# Patient Record
Sex: Male | Born: 1945 | Hispanic: No | Marital: Married | Smoking: Never smoker
Health system: Southern US, Community
[De-identification: ages and names within clinical notes are randomized; demographics above are authoritative.]

## PROBLEM LIST (undated history)

## (undated) DIAGNOSIS — Q394 Esophageal web: Secondary | ICD-10-CM

## (undated) DIAGNOSIS — D649 Anemia, unspecified: Secondary | ICD-10-CM

## (undated) DIAGNOSIS — R55 Syncope and collapse: Secondary | ICD-10-CM

## (undated) DIAGNOSIS — G4730 Sleep apnea, unspecified: Secondary | ICD-10-CM

## (undated) DIAGNOSIS — C8290 Follicular lymphoma, unspecified, unspecified site: Secondary | ICD-10-CM

## (undated) HISTORY — DX: Esophageal web: Q39.4

## (undated) HISTORY — PX: NO PAST SURGERIES: SHX2092

## (undated) HISTORY — DX: Follicular lymphoma, unspecified, unspecified site: C82.90

## (undated) HISTORY — DX: Sleep apnea, unspecified: G47.30

## (undated) HISTORY — DX: Anemia, unspecified: D64.9

## (undated) HISTORY — DX: Syncope and collapse: R55

---

## 2000-06-29 ENCOUNTER — Encounter: Payer: Self-pay | Admitting: Cardiology

## 2000-06-29 ENCOUNTER — Ambulatory Visit (HOSPITAL_COMMUNITY): Admission: RE | Admit: 2000-06-29 | Discharge: 2000-06-29 | Payer: Self-pay | Admitting: Cardiology

## 2007-10-11 ENCOUNTER — Emergency Department (HOSPITAL_COMMUNITY): Admission: EM | Admit: 2007-10-11 | Discharge: 2007-10-11 | Payer: Self-pay | Admitting: Emergency Medicine

## 2010-02-15 ENCOUNTER — Ambulatory Visit (HOSPITAL_COMMUNITY)
Admission: AD | Admit: 2010-02-15 | Discharge: 2010-02-15 | Disposition: A | Discharge status: Home / Self Care | Payer: Medicare Other | Source: Ambulatory Visit | Attending: Pulmonary Disease | Admitting: Pulmonary Disease

## 2010-02-15 DIAGNOSIS — R55 Syncope and collapse: Secondary | ICD-10-CM | POA: Insufficient documentation

## 2010-02-15 LAB — CBC
HCT: 33.5 % — ABNORMAL LOW (ref 39.0–52.0)
Hemoglobin: 11 g/dL — ABNORMAL LOW (ref 13.0–17.0)
MCH: 24.1 pg — ABNORMAL LOW (ref 26.0–34.0)
MCHC: 32.8 g/dL (ref 30.0–36.0)
MCV: 73.5 fL — ABNORMAL LOW (ref 78.0–100.0)
Platelets: 227 10*3/uL (ref 150–400)
RBC: 4.56 MIL/uL (ref 4.22–5.81)
RDW: 19.1 % — ABNORMAL HIGH (ref 11.5–15.5)
WBC: 5.2 10*3/uL (ref 4.0–10.5)

## 2010-02-15 LAB — COMPREHENSIVE METABOLIC PANEL
ALT: 20 U/L (ref 0–53)
AST: 23 U/L (ref 0–37)
Albumin: 3.3 g/dL — ABNORMAL LOW (ref 3.5–5.2)
Alkaline Phosphatase: 64 U/L (ref 39–117)
Calcium: 8.7 mg/dL (ref 8.4–10.5)
GFR calc Af Amer: >60 mL/min (ref >60)
Potassium: 4.2 mEq/L (ref 3.5–5.1)
Sodium: 138 mEq/L (ref 135–145)
Total Protein: 6.3 g/dL (ref 6.0–8.3)

## 2010-02-15 LAB — DIFFERENTIAL
Basophils Absolute: 0 10*3/uL (ref 0.0–0.1)
Basophils Relative: 0 % (ref 0–1)
Eosinophils Absolute: 0 10*3/uL (ref 0.0–0.7)
Eosinophils Relative: 0 % (ref 0–5)
Lymphocytes Relative: 30 % (ref 12–46)
Lymphs Abs: 1.5 10*3/uL (ref 0.7–4.0)
Monocytes Absolute: 0.6 10*3/uL (ref 0.1–1.0)
Monocytes Relative: 12 % (ref 3–12)
Neutro Abs: 3 10*3/uL (ref 1.7–7.7)
Neutrophils Relative %: 58 % (ref 43–77)

## 2010-02-15 LAB — CARDIAC PANEL(CRET KIN+CKTOT+MB+TROPI)
CK, MB: 0.6 ng/mL (ref 0.3–4.0)
Relative Index: 0.3 (ref 0.0–2.5)
Total CK: 231 U/L (ref 7–232)

## 2010-02-15 LAB — MAGNESIUM: Magnesium: 1.9 mg/dL (ref 1.5–2.5)

## 2010-02-15 LAB — D-DIMER, QUANTITATIVE: D-Dimer, Quant: 0.41 ug/mL-FEU (ref 0.00–0.48)

## 2010-02-16 LAB — GLUCOSE, CAPILLARY: Glucose-Capillary: 146 mg/dL — ABNORMAL HIGH (ref 70–99)

## 2010-02-21 ENCOUNTER — Other Ambulatory Visit (HOSPITAL_COMMUNITY): Payer: Self-pay | Admitting: Cardiology

## 2010-03-01 NOTE — H&P (Signed)
  NAMEREXFORD, PREVO             ACCOUNT NO.:  [id number]  MEDICAL RECORD NO.:  [id number]           PATIENT TYPE:  O  LOCATION:  DAY                           FACILITY:  [facility]  PHYSICIAN:  Lanore Renderos L. 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  He was taken to the recovery room and there was found to appear somewhat pale but he had normal blood pressure.  He has some problems with blood sugar but his blood sugar was normal and he had a number of other evaluations including an EKG that did not show any acute changes.  He had extensive lab work that was all within normal limits except he was mildly anemic. His exam showed that he was awake and alert.  He did not appear to be in any acute distress.  His heart was regular.  Abdomen soft and he did not have any neurological abnormalities.  After he received some IV fluids, he had orthostatic vitals checked which were okay.  He desired to go home and so he went home with someone driving him.  He was told to return if he had more trouble.  I offered overnight hospitalization, which he did not want to do and he will follow up with his primary care physician, who is Dr. Virgina Organ in [facility] area.     Quetzaly Ebner L. Juanetta Gosling, M.D.     ELH/MEDQ  D:  02/15/2010  T:  02/15/2010  Job:  [id number]  Electronically Signed by Kari Baars M.D. on 03/01/2010 06:15:01 PM

## 2010-05-12 ENCOUNTER — Other Ambulatory Visit: Payer: Self-pay | Admitting: Cardiology

## 2010-05-12 DIAGNOSIS — R55 Syncope and collapse: Secondary | ICD-10-CM

## 2010-05-19 ENCOUNTER — Encounter (HOSPITAL_COMMUNITY): Admission: RE | Admit: 2010-05-19 | Payer: Medicare Other | Source: Ambulatory Visit

## 2010-05-19 ENCOUNTER — Ambulatory Visit (INDEPENDENT_AMBULATORY_CARE_PROVIDER_SITE_OTHER): Payer: Medicare Other | Admitting: *Deleted

## 2010-05-19 ENCOUNTER — Encounter (HOSPITAL_COMMUNITY): Payer: Self-pay

## 2010-05-19 ENCOUNTER — Ambulatory Visit (HOSPITAL_COMMUNITY)
Admission: RE | Admit: 2010-05-19 | Discharge: 2010-05-19 | Disposition: A | Discharge status: Home / Self Care | Payer: Medicare Other | Source: Ambulatory Visit | Attending: Cardiology | Admitting: Cardiology

## 2010-05-19 ENCOUNTER — Encounter (HOSPITAL_COMMUNITY): Payer: Medicare Other

## 2010-05-19 ENCOUNTER — Encounter (HOSPITAL_COMMUNITY)
Admission: RE | Admit: 2010-05-19 | Discharge: 2010-05-19 | Disposition: A | Discharge status: Home / Self Care | Payer: Medicare Other | Source: Ambulatory Visit | Attending: Cardiology | Admitting: Cardiology

## 2010-05-19 DIAGNOSIS — R55 Syncope and collapse: Secondary | ICD-10-CM | POA: Insufficient documentation

## 2010-05-19 DIAGNOSIS — R0789 Other chest pain: Secondary | ICD-10-CM | POA: Insufficient documentation

## 2010-05-19 DIAGNOSIS — R079 Chest pain, unspecified: Secondary | ICD-10-CM

## 2010-05-19 MED ORDER — TECHNETIUM TC 99M TETROFOSMIN IV KIT
30.0000 | PACK | Freq: Once | INTRAVENOUS | Status: AC | PRN
  Administered 2010-05-19: 29.5 via INTRAVENOUS

## 2010-05-19 MED ORDER — TECHNETIUM TC 99M TETROFOSMIN IV KIT
10.0000 | PACK | Freq: Once | INTRAVENOUS | Status: AC | PRN
  Administered 2010-05-19: 9 via INTRAVENOUS

## 2010-05-19 NOTE — Progress Notes (Deleted)

## 2010-05-22 ENCOUNTER — Encounter: Payer: Self-pay | Admitting: *Deleted

## 2010-05-22 DIAGNOSIS — R55 Syncope and collapse: Secondary | ICD-10-CM | POA: Insufficient documentation

## 2010-05-22 NOTE — Progress Notes (Signed)
Please see dictated report for Stress Nuclear Study.   

## 2010-05-25 ENCOUNTER — Encounter: Payer: Self-pay | Admitting: *Deleted

## 2011-03-19 DIAGNOSIS — B029 Zoster without complications: Secondary | ICD-10-CM | POA: Diagnosis not present

## 2011-03-19 DIAGNOSIS — E119 Type 2 diabetes mellitus without complications: Secondary | ICD-10-CM | POA: Diagnosis not present

## 2011-03-22 DIAGNOSIS — R972 Elevated prostate specific antigen [PSA]: Secondary | ICD-10-CM | POA: Diagnosis not present

## 2011-03-22 DIAGNOSIS — E119 Type 2 diabetes mellitus without complications: Secondary | ICD-10-CM | POA: Diagnosis not present

## 2012-02-20 DIAGNOSIS — N320 Bladder-neck obstruction: Secondary | ICD-10-CM | POA: Diagnosis not present

## 2012-02-20 DIAGNOSIS — N400 Enlarged prostate without lower urinary tract symptoms: Secondary | ICD-10-CM | POA: Diagnosis not present

## 2012-02-20 DIAGNOSIS — E119 Type 2 diabetes mellitus without complications: Secondary | ICD-10-CM | POA: Diagnosis not present

## 2012-06-26 ENCOUNTER — Encounter: Payer: Medicare Other | Admitting: Internal Medicine

## 2012-07-12 DIAGNOSIS — R262 Difficulty in walking, not elsewhere classified: Secondary | ICD-10-CM | POA: Diagnosis not present

## 2012-08-06 DIAGNOSIS — H906 Mixed conductive and sensorineural hearing loss, bilateral: Secondary | ICD-10-CM | POA: Diagnosis not present

## 2012-09-13 DIAGNOSIS — R059 Cough, unspecified: Secondary | ICD-10-CM | POA: Diagnosis not present

## 2012-09-13 DIAGNOSIS — J40 Bronchitis, not specified as acute or chronic: Secondary | ICD-10-CM | POA: Diagnosis not present

## 2012-09-13 DIAGNOSIS — R0602 Shortness of breath: Secondary | ICD-10-CM | POA: Diagnosis not present

## 2012-09-13 DIAGNOSIS — R05 Cough: Secondary | ICD-10-CM | POA: Diagnosis not present

## 2012-09-19 DIAGNOSIS — K219 Gastro-esophageal reflux disease without esophagitis: Secondary | ICD-10-CM | POA: Diagnosis not present

## 2012-09-19 DIAGNOSIS — R1310 Dysphagia, unspecified: Secondary | ICD-10-CM | POA: Diagnosis not present

## 2012-10-17 DIAGNOSIS — R05 Cough: Secondary | ICD-10-CM | POA: Diagnosis not present

## 2012-10-17 DIAGNOSIS — J40 Bronchitis, not specified as acute or chronic: Secondary | ICD-10-CM | POA: Diagnosis not present

## 2012-10-17 DIAGNOSIS — R059 Cough, unspecified: Secondary | ICD-10-CM | POA: Diagnosis not present

## 2012-11-01 DIAGNOSIS — R1310 Dysphagia, unspecified: Secondary | ICD-10-CM | POA: Diagnosis not present

## 2012-11-01 DIAGNOSIS — K449 Diaphragmatic hernia without obstruction or gangrene: Secondary | ICD-10-CM | POA: Diagnosis not present

## 2012-11-01 DIAGNOSIS — K319 Disease of stomach and duodenum, unspecified: Secondary | ICD-10-CM | POA: Diagnosis not present

## 2012-11-01 DIAGNOSIS — K219 Gastro-esophageal reflux disease without esophagitis: Secondary | ICD-10-CM | POA: Diagnosis not present

## 2012-11-01 DIAGNOSIS — K2970 Gastritis, unspecified, without bleeding: Secondary | ICD-10-CM | POA: Diagnosis not present

## 2012-11-01 DIAGNOSIS — D131 Benign neoplasm of stomach: Secondary | ICD-10-CM | POA: Diagnosis not present

## 2012-11-01 DIAGNOSIS — K2289 Other specified disease of esophagus: Secondary | ICD-10-CM | POA: Diagnosis not present

## 2012-11-01 DIAGNOSIS — D371 Neoplasm of uncertain behavior of stomach: Secondary | ICD-10-CM | POA: Diagnosis not present

## 2012-11-01 DIAGNOSIS — K228 Other specified diseases of esophagus: Secondary | ICD-10-CM | POA: Diagnosis not present

## 2012-11-01 DIAGNOSIS — K222 Esophageal obstruction: Secondary | ICD-10-CM | POA: Diagnosis not present

## 2012-11-15 DIAGNOSIS — IMO0001 Reserved for inherently not codable concepts without codable children: Secondary | ICD-10-CM | POA: Diagnosis not present

## 2012-11-15 DIAGNOSIS — Z125 Encounter for screening for malignant neoplasm of prostate: Secondary | ICD-10-CM | POA: Diagnosis not present

## 2012-11-15 DIAGNOSIS — J45909 Unspecified asthma, uncomplicated: Secondary | ICD-10-CM | POA: Diagnosis not present

## 2012-11-15 DIAGNOSIS — Z1329 Encounter for screening for other suspected endocrine disorder: Secondary | ICD-10-CM | POA: Diagnosis not present

## 2012-11-15 DIAGNOSIS — E119 Type 2 diabetes mellitus without complications: Secondary | ICD-10-CM | POA: Diagnosis not present

## 2012-11-15 DIAGNOSIS — R079 Chest pain, unspecified: Secondary | ICD-10-CM | POA: Diagnosis not present

## 2012-12-06 DIAGNOSIS — C8293 Follicular lymphoma, unspecified, intra-abdominal lymph nodes: Secondary | ICD-10-CM | POA: Diagnosis not present

## 2012-12-06 DIAGNOSIS — N200 Calculus of kidney: Secondary | ICD-10-CM | POA: Diagnosis not present

## 2012-12-06 DIAGNOSIS — D500 Iron deficiency anemia secondary to blood loss (chronic): Secondary | ICD-10-CM | POA: Diagnosis not present

## 2012-12-06 DIAGNOSIS — C8589 Other specified types of non-Hodgkin lymphoma, extranodal and solid organ sites: Secondary | ICD-10-CM | POA: Diagnosis not present

## 2012-12-10 ENCOUNTER — Encounter (HOSPITAL_COMMUNITY): Payer: Self-pay

## 2012-12-20 DIAGNOSIS — C8583 Other specified types of non-Hodgkin lymphoma, intra-abdominal lymph nodes: Secondary | ICD-10-CM | POA: Diagnosis not present

## 2012-12-20 DIAGNOSIS — C8299 Follicular lymphoma, unspecified, extranodal and solid organ sites: Secondary | ICD-10-CM | POA: Diagnosis not present

## 2012-12-20 DIAGNOSIS — C8589 Other specified types of non-Hodgkin lymphoma, extranodal and solid organ sites: Secondary | ICD-10-CM | POA: Diagnosis not present

## 2013-01-07 DIAGNOSIS — C8299 Follicular lymphoma, unspecified, extranodal and solid organ sites: Secondary | ICD-10-CM | POA: Diagnosis not present

## 2013-01-09 DIAGNOSIS — H6590 Unspecified nonsuppurative otitis media, unspecified ear: Secondary | ICD-10-CM | POA: Diagnosis not present

## 2013-01-09 DIAGNOSIS — H905 Unspecified sensorineural hearing loss: Secondary | ICD-10-CM | POA: Diagnosis not present

## 2013-01-29 DIAGNOSIS — Z79899 Other long term (current) drug therapy: Secondary | ICD-10-CM | POA: Diagnosis not present

## 2013-05-08 DIAGNOSIS — H538 Other visual disturbances: Secondary | ICD-10-CM | POA: Diagnosis not present

## 2013-05-08 DIAGNOSIS — E119 Type 2 diabetes mellitus without complications: Secondary | ICD-10-CM | POA: Diagnosis not present

## 2013-05-08 DIAGNOSIS — H2510 Age-related nuclear cataract, unspecified eye: Secondary | ICD-10-CM | POA: Diagnosis not present

## 2013-05-09 DIAGNOSIS — IMO0001 Reserved for inherently not codable concepts without codable children: Secondary | ICD-10-CM | POA: Diagnosis not present

## 2013-05-09 DIAGNOSIS — G4730 Sleep apnea, unspecified: Secondary | ICD-10-CM | POA: Diagnosis not present

## 2013-05-13 DIAGNOSIS — E119 Type 2 diabetes mellitus without complications: Secondary | ICD-10-CM | POA: Diagnosis not present

## 2013-05-13 DIAGNOSIS — I10 Essential (primary) hypertension: Secondary | ICD-10-CM | POA: Diagnosis not present

## 2013-05-16 DIAGNOSIS — K449 Diaphragmatic hernia without obstruction or gangrene: Secondary | ICD-10-CM | POA: Diagnosis not present

## 2013-05-16 DIAGNOSIS — C8583 Other specified types of non-Hodgkin lymphoma, intra-abdominal lymph nodes: Secondary | ICD-10-CM | POA: Diagnosis not present

## 2013-05-16 DIAGNOSIS — D509 Iron deficiency anemia, unspecified: Secondary | ICD-10-CM | POA: Diagnosis not present

## 2013-05-16 DIAGNOSIS — K2960 Other gastritis without bleeding: Secondary | ICD-10-CM | POA: Diagnosis not present

## 2013-05-16 DIAGNOSIS — D131 Benign neoplasm of stomach: Secondary | ICD-10-CM | POA: Diagnosis not present

## 2013-05-16 DIAGNOSIS — C8299 Follicular lymphoma, unspecified, extranodal and solid organ sites: Secondary | ICD-10-CM | POA: Diagnosis not present

## 2013-06-26 DIAGNOSIS — IMO0001 Reserved for inherently not codable concepts without codable children: Secondary | ICD-10-CM | POA: Diagnosis not present

## 2013-06-26 DIAGNOSIS — R5381 Other malaise: Secondary | ICD-10-CM | POA: Diagnosis not present

## 2013-06-30 ENCOUNTER — Telehealth: Payer: Self-pay | Admitting: Neurology

## 2013-06-30 ENCOUNTER — Encounter: Payer: Self-pay | Admitting: Neurology

## 2013-06-30 ENCOUNTER — Institutional Professional Consult (permissible substitution): Payer: Self-pay | Admitting: Neurology

## 2013-06-30 ENCOUNTER — Ambulatory Visit (INDEPENDENT_AMBULATORY_CARE_PROVIDER_SITE_OTHER): Payer: Medicare Other | Admitting: Neurology

## 2013-06-30 VITALS — BP 151/91 | HR 104 | Temp 96.9°F | Ht 69.0 in | Wt 169.0 lb

## 2013-06-30 DIAGNOSIS — H9193 Unspecified hearing loss, bilateral: Secondary | ICD-10-CM

## 2013-06-30 DIAGNOSIS — R400 Somnolence: Secondary | ICD-10-CM

## 2013-06-30 DIAGNOSIS — G2581 Restless legs syndrome: Secondary | ICD-10-CM

## 2013-06-30 DIAGNOSIS — G4761 Periodic limb movement disorder: Secondary | ICD-10-CM

## 2013-06-30 DIAGNOSIS — R413 Other amnesia: Secondary | ICD-10-CM

## 2013-06-30 DIAGNOSIS — G4733 Obstructive sleep apnea (adult) (pediatric): Secondary | ICD-10-CM | POA: Diagnosis not present

## 2013-06-30 DIAGNOSIS — G4710 Hypersomnia, unspecified: Secondary | ICD-10-CM | POA: Diagnosis not present

## 2013-06-30 DIAGNOSIS — H9190 Unspecified hearing loss, unspecified ear: Secondary | ICD-10-CM | POA: Diagnosis not present

## 2013-06-30 NOTE — Telephone Encounter (Signed)
Please call Dr. Charlesetta Shanks office for report on cognitive testing to be faxed to me. Her ph: [phone] or [phone], thx Pls give fax number to the area, where Renetta Chalk and Cassandra are.

## 2013-06-30 NOTE — Telephone Encounter (Signed)
Pt should have signed release of info form today at check out. Pls check

## 2013-06-30 NOTE — Telephone Encounter (Signed)
Please check with patient on Dr. Alean Rinne first name and whether pt has signed release of info for Dr. Alean Rinne as well as I want to keep him posted. Please call pt or wife or Daughter, Lutricia Horsfall

## 2013-06-30 NOTE — Telephone Encounter (Signed)
Called Dr Levie Heritage office and they are requesting a release before sending the report, please fax release to [phone].

## 2013-06-30 NOTE — Patient Instructions (Addendum)
I am going to repeat your sleep study and we will get you back on CPAP.  We may need to treat your RLS and your leg kicking.  Your memory loss seems to be very mild and you have some issues with focus and attention, but we have to keep in mind, that you have not been treating your sleep apnea and you've not had your PLMs treated either. You may have problems with your concentration, focus and attention from not sleeping well.  We will do a brain MRI.   Use OTC ear drops to loosen your ear wax on the right.
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
## 2013-07-01 LAB — RPR: RPR: NONREACTIVE

## 2013-07-01 LAB — AMMONIA: Ammonia: 49 ug/dL (ref 27–102)

## 2013-07-01 LAB — TSH: TSH: 1.66 u[IU]/mL (ref 0.450–4.500)

## 2013-07-01 LAB — SEDIMENTATION RATE: Sed Rate: 24 mm/hr (ref 0–30)

## 2013-07-01 LAB — HGB A1C W/O EAG: HEMOGLOBIN A1C: 7.6 % — AB (ref 4.8–5.6)

## 2013-07-01 NOTE — Progress Notes (Signed)
Quick Note:  Please call patient (Dr. Michela Pitcher), and advise him that his TSH, RPR, ESR and ammonia level were normal. His hemoglobin A1c was mildly elevated at 7.6, indicating a mildly suboptimal treatment of his diabetes. He should discuss this with his primary care physician as far as adjustment of his medication for diabetes. Star Age, MD, PhD [facility] ([facility])  ______

## 2013-07-02 ENCOUNTER — Ambulatory Visit (INDEPENDENT_AMBULATORY_CARE_PROVIDER_SITE_OTHER): Payer: Medicare Other | Admitting: Radiology

## 2013-07-02 DIAGNOSIS — R413 Other amnesia: Secondary | ICD-10-CM

## 2013-07-02 DIAGNOSIS — G4733 Obstructive sleep apnea (adult) (pediatric): Secondary | ICD-10-CM

## 2013-07-02 DIAGNOSIS — H9193 Unspecified hearing loss, bilateral: Secondary | ICD-10-CM

## 2013-07-02 DIAGNOSIS — R400 Somnolence: Secondary | ICD-10-CM

## 2013-07-02 DIAGNOSIS — G4761 Periodic limb movement disorder: Secondary | ICD-10-CM

## 2013-07-02 DIAGNOSIS — G2581 Restless legs syndrome: Secondary | ICD-10-CM

## 2013-07-02 NOTE — Progress Notes (Addendum)
Quick Note:  I called and spoke to Dr. Michela Pitcher as well as Helene Kelp, pt cannot hear well on phone) at his office, relating the labs results. Copy placed up front for him when in for EEG today. He verbalized understanding. ______

## 2013-07-02 NOTE — Procedures (Signed)
      Benjamin York is a 68 year old patient with a history of a memory disorder and a history of diabetes. He has had a recent decline in cognitive functioning, and he is being evaluated for this issue.  This is a routine EEG. No skull defects are noted. Medications include aspirin, vitamin D, Glucophage, and Zocor.  EEG classification: Dysrhythmia grade 1 left temporal  Description of the recording: The background rhythms of this recording consists of a fairly well modulated medium amplitude alpha rhythm of 10 Hz that is reactive to eye opening and closure. As the record progresses, the patient appears to remain in the waking state throughout the recording. Photic stimulation is performed and results in a bilateral and symmetric photic driving response. Hyperventilation was not performed. Intermittently during the recording, mild brief 6 Hz theta slowing is seen emanating from the left mid temporal area. This is seen on several occasions during the recording. At no time during the recording does there appear to be evidence of actual spike or spike wave discharges. EKG monitor shows no evidence of cardiac rhythm abnormalities with a heart rate of 84.  Impression: This is a minimally abnormal EEG recording secondary to mild intermittent theta slowing emanating from the left temporal area. This recording suggests a focal abnormality involving the left brain, without definite epileptiform discharges. No electrographic seizures were recorded. Clinical correlation is required.

## 2013-07-03 NOTE — Progress Notes (Signed)
Quick Note:  Please call and advise the patient that the EEG or brain wave test we performed was reported as minimally abnormal showing mild intermittent slowing in the left  temporal area. This recording suggests a focal abnormality involving the left brain, without definite epileptiform discharges. No seizures were recorded. What this finding means in the greater context of his clinical picture is unclear at this time. Let's look at his brain MRI as well which should be scheduled at this time - please inquire. I think we have to await more test results. There is no immediate action required on this EEG result. Please also inquire whether he has been scheduled with Dr. Valentina Shaggy as yet?   Star Age, MD, PhD    ______

## 2013-07-07 ENCOUNTER — Telehealth: Payer: Self-pay | Admitting: Neurology

## 2013-07-07 NOTE — Telephone Encounter (Signed)
I called daughter, she wanted to speak with Dr. Rexene Alberts specifically.  I forwarded message.  She stated this was about CPAP.  [phone].  I relayed that she needs to be on HIPPA.

## 2013-07-07 NOTE — Progress Notes (Signed)
Quick Note:  I called and spoke to pt, who was having trouble hearing. He gave me fax # to fax report, along with Dr. Guadelupe Sabin comments. He is to callback if questions. ______

## 2013-07-07 NOTE — Telephone Encounter (Signed)
Patient's daughter calling to give an update on the patient.

## 2013-07-08 ENCOUNTER — Ambulatory Visit (INDEPENDENT_AMBULATORY_CARE_PROVIDER_SITE_OTHER): Payer: Medicare Other | Admitting: Neurology

## 2013-07-08 DIAGNOSIS — R0609 Other forms of dyspnea: Secondary | ICD-10-CM | POA: Diagnosis not present

## 2013-07-08 DIAGNOSIS — H9193 Unspecified hearing loss, bilateral: Secondary | ICD-10-CM

## 2013-07-08 DIAGNOSIS — G4761 Periodic limb movement disorder: Secondary | ICD-10-CM | POA: Diagnosis not present

## 2013-07-08 DIAGNOSIS — R413 Other amnesia: Secondary | ICD-10-CM

## 2013-07-08 DIAGNOSIS — G2581 Restless legs syndrome: Secondary | ICD-10-CM

## 2013-07-08 DIAGNOSIS — R0989 Other specified symptoms and signs involving the circulatory and respiratory systems: Secondary | ICD-10-CM

## 2013-07-08 DIAGNOSIS — G4733 Obstructive sleep apnea (adult) (pediatric): Secondary | ICD-10-CM

## 2013-07-08 DIAGNOSIS — R400 Somnolence: Secondary | ICD-10-CM

## 2013-07-10 ENCOUNTER — Ambulatory Visit
Admission: RE | Admit: 2013-07-10 | Discharge: 2013-07-10 | Disposition: A | Discharge status: Home / Self Care | Payer: Medicare Other | Source: Ambulatory Visit | Attending: Neurology | Admitting: Neurology

## 2013-07-10 DIAGNOSIS — G2581 Restless legs syndrome: Secondary | ICD-10-CM

## 2013-07-10 DIAGNOSIS — F0390 Unspecified dementia without behavioral disturbance: Secondary | ICD-10-CM | POA: Diagnosis not present

## 2013-07-10 DIAGNOSIS — H9190 Unspecified hearing loss, unspecified ear: Secondary | ICD-10-CM | POA: Diagnosis not present

## 2013-07-10 DIAGNOSIS — G4733 Obstructive sleep apnea (adult) (pediatric): Secondary | ICD-10-CM

## 2013-07-10 DIAGNOSIS — H9193 Unspecified hearing loss, bilateral: Secondary | ICD-10-CM

## 2013-07-10 DIAGNOSIS — R413 Other amnesia: Secondary | ICD-10-CM | POA: Diagnosis not present

## 2013-07-10 DIAGNOSIS — G4761 Periodic limb movement disorder: Secondary | ICD-10-CM

## 2013-07-10 DIAGNOSIS — R400 Somnolence: Secondary | ICD-10-CM

## 2013-07-10 MED ORDER — GADOBENATE DIMEGLUMINE 529 MG/ML IV SOLN
15.0000 mL | Freq: Once | INTRAVENOUS | Status: AC | PRN
  Administered 2013-07-10: 15 mL via INTRAVENOUS

## 2013-07-10 NOTE — Telephone Encounter (Signed)
I called and talked to his daughter. I explained EEG findings. I also  told her that I would be reading his sleep study expeditiously tomorrow. We will also try to expedite the reading on his MRI by tomorrow so I can call her back with both test results. She was in agreement and appreciative.

## 2013-07-11 ENCOUNTER — Telehealth: Payer: Self-pay | Admitting: Neurology

## 2013-07-11 NOTE — Telephone Encounter (Signed)
Please call daughter, Lutricia Horsfall, to make FU appt. Her cell # [phone]. thx

## 2013-07-14 NOTE — Telephone Encounter (Signed)
I called and spoke with the patient's daughter about scheduling follow up appt. with Dr. Rexene Alberts. Patient is scheduled for August 21,2015 at 10:30 am with arrival time of 10:15 am. Patient's daughter has asked that sleep study and MRI reports be sent to her office. ( Fax# [phone].

## 2013-07-15 ENCOUNTER — Telehealth: Payer: Self-pay | Admitting: Neurology

## 2013-07-15 DIAGNOSIS — G2581 Restless legs syndrome: Secondary | ICD-10-CM

## 2013-07-15 DIAGNOSIS — G479 Sleep disorder, unspecified: Secondary | ICD-10-CM

## 2013-07-15 DIAGNOSIS — G4761 Periodic limb movement disorder: Secondary | ICD-10-CM

## 2013-07-15 MED ORDER — ROPINIROLE HCL 0.25 MG PO TABS: ORAL_TABLET | ORAL | Status: AC

## 2013-07-15 NOTE — Telephone Encounter (Addendum)
I called daughter, and relayed Dr. Guadelupe Sabin note.   Pt has been placed on requip since Monday.  She stated that she had spoken to DrMarland Kitchen Rexene Alberts and knew the plan.  She knew pt was iron deficit, and will f/u with pcp about this.  She called back and relayed that pt has appt with [facility] on 07-29-13.

## 2013-07-15 NOTE — Telephone Encounter (Signed)
Dr Oneida Alar left pager number to contact concerning patient,  [phone]

## 2013-07-15 NOTE — Telephone Encounter (Signed)
Tried a couple of times. It sounds like a pager number. Pls try again and maybe get direct phone # or cell #, thx

## 2013-07-15 NOTE — Telephone Encounter (Signed)
Please call patient stopped her and advised her that I have sent a prescription for Requip generic to the pharmacy on file to start treatment for his restless leg syndrome and periodic leg movements. I was trying to order lab work for iron deficiency and anemia but the insurance does not seem to cover this for this indication. He may be able to get workup for iron deficiency to his primary care physician. Please advise daughter in that regard

## 2013-07-15 NOTE — Telephone Encounter (Signed)
I just finished a 15 minute conversation with Dr. Oneida Alar regarding updates on Dr. Michela Pitcher: I explained to her my findings on his sleep study and explained to her that while he does have a sleep disorder, significant sleep apnea was not detected. He did have poor sleep consolidation and poor sleep efficiency and significant PLMS with arousals. 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 I also told her that I had a long phone conversation with the patient's daughter on Friday, 07/11/13, during which I explained his MRI findings to the daughter as well as the sleep study findings. She was agreeable to pursuing workup and treatment for RLS and PLMS. She reported that he has been sleeping with his CPAP machine and slept better. I explained to the daughter that based on his sleep study report there was no indication to bring him back for a CPAP titration.  Furthermore, I explained to Dr. Oneida Alar the findings on his brain MRI and that the daughter called back yesterday to request a second read by a radiologist at [facility].  Dr. Oneida Alar shared with me a office note from his primary care physician which quoted me and saying that I do not see a reason why the patient should be restricted in his practice. When I initially talked to the patient and his family I did not have any additional information as to what his issues had been at work. I also did not have his neuropsych eval report from Dr. Birdie Sons for review at the time. Based on his initial office visit and the bedside memory score of 30 out of 30 on the MMSE I explained to the family that he did not have any obvious signs or symptoms of dementia. Frontal lobe dysfunction can be subtle, frontotemporal dementia is a complex disorder which requires diagnostic help in the form of a neuropsychological evaluation, imaging testing, and ruling out of treatable causes. I also explained to the family that we needed to do more  workup and I personally did not suggest workup at [facility] at the time but did talk to the daughter on 07/11/2013 about potentially pursuing a referral to either [facility] or [facility] to behavioral neurology. I also mentioned to the daughter that we can potentially try to get a PET scan for further diagnostic help.  As it stands, I offered the following to the daughter, lab work and treatment form PLMD, referral to behavioral neurology at either [facility] or [place]. The patient has already been scheduled for a neuropsychological evaluation at [facility] and she wanted to wait until after that appointment.

## 2013-07-16 ENCOUNTER — Encounter: Payer: Self-pay | Admitting: *Deleted

## 2013-07-19 DIAGNOSIS — G3184 Mild cognitive impairment, so stated: Secondary | ICD-10-CM | POA: Diagnosis not present

## 2013-07-21 ENCOUNTER — Encounter: Payer: Self-pay | Admitting: Neurology

## 2013-07-21 ENCOUNTER — Other Ambulatory Visit: Payer: Self-pay | Admitting: Neurology

## 2013-07-21 DIAGNOSIS — R0609 Other forms of dyspnea: Secondary | ICD-10-CM | POA: Diagnosis not present

## 2013-07-21 DIAGNOSIS — R413 Other amnesia: Secondary | ICD-10-CM

## 2013-07-21 DIAGNOSIS — R0989 Other specified symptoms and signs involving the circulatory and respiratory systems: Secondary | ICD-10-CM | POA: Diagnosis not present

## 2013-07-21 NOTE — Progress Notes (Signed)
Faxing letter to Dr. Oneida Alar today.  Fax [phone]

## 2013-07-21 NOTE — Progress Notes (Signed)
I talked to patient's daughter on Friday afternoon, 07/18/2013. She is requesting that her father's brain MRI PRE read by Dr. Bernita Raisin at [facility]. I have placed an order for this.

## 2013-07-29 DIAGNOSIS — R000 Tachycardia, unspecified: Secondary | ICD-10-CM | POA: Diagnosis not present

## 2013-07-29 DIAGNOSIS — G3184 Mild cognitive impairment, so stated: Secondary | ICD-10-CM | POA: Diagnosis not present

## 2013-07-29 DIAGNOSIS — L299 Pruritus, unspecified: Secondary | ICD-10-CM | POA: Diagnosis not present

## 2013-07-29 DIAGNOSIS — G4733 Obstructive sleep apnea (adult) (pediatric): Secondary | ICD-10-CM | POA: Diagnosis not present

## 2013-07-29 DIAGNOSIS — Z79899 Other long term (current) drug therapy: Secondary | ICD-10-CM | POA: Diagnosis not present

## 2013-07-29 DIAGNOSIS — E119 Type 2 diabetes mellitus without complications: Secondary | ICD-10-CM | POA: Diagnosis not present

## 2013-07-29 DIAGNOSIS — G2581 Restless legs syndrome: Secondary | ICD-10-CM | POA: Diagnosis not present

## 2013-07-30 DIAGNOSIS — H906 Mixed conductive and sensorineural hearing loss, bilateral: Secondary | ICD-10-CM | POA: Diagnosis not present

## 2013-07-30 DIAGNOSIS — H6690 Otitis media, unspecified, unspecified ear: Secondary | ICD-10-CM | POA: Diagnosis not present

## 2013-07-30 DIAGNOSIS — Z462 Encounter for fitting and adjustment of other devices related to nervous system and special senses: Secondary | ICD-10-CM | POA: Diagnosis not present

## 2013-08-04 DIAGNOSIS — R5381 Other malaise: Secondary | ICD-10-CM | POA: Diagnosis not present

## 2013-08-04 DIAGNOSIS — K922 Gastrointestinal hemorrhage, unspecified: Secondary | ICD-10-CM | POA: Diagnosis not present

## 2013-08-04 DIAGNOSIS — G2581 Restless legs syndrome: Secondary | ICD-10-CM | POA: Diagnosis not present

## 2013-08-04 DIAGNOSIS — R5383 Other fatigue: Secondary | ICD-10-CM | POA: Diagnosis not present

## 2013-08-04 DIAGNOSIS — D649 Anemia, unspecified: Secondary | ICD-10-CM | POA: Diagnosis not present

## 2013-08-08 DIAGNOSIS — D508 Other iron deficiency anemias: Secondary | ICD-10-CM | POA: Diagnosis not present

## 2013-08-08 DIAGNOSIS — D509 Iron deficiency anemia, unspecified: Secondary | ICD-10-CM | POA: Diagnosis not present

## 2013-08-08 DIAGNOSIS — C8299 Follicular lymphoma, unspecified, extranodal and solid organ sites: Secondary | ICD-10-CM | POA: Diagnosis not present

## 2013-08-08 DIAGNOSIS — D500 Iron deficiency anemia secondary to blood loss (chronic): Secondary | ICD-10-CM | POA: Diagnosis not present

## 2013-08-11 DIAGNOSIS — C8299 Follicular lymphoma, unspecified, extranodal and solid organ sites: Secondary | ICD-10-CM | POA: Diagnosis not present

## 2013-08-11 DIAGNOSIS — D500 Iron deficiency anemia secondary to blood loss (chronic): Secondary | ICD-10-CM | POA: Diagnosis not present

## 2013-08-13 ENCOUNTER — Ambulatory Visit: Payer: Medicare Other | Admitting: Gastroenterology

## 2013-08-13 ENCOUNTER — Telehealth: Payer: Self-pay | Admitting: Gastroenterology

## 2013-08-13 NOTE — Telephone Encounter (Signed)
Someone from patient's office called to let us know that Dr Michela Pitcher already has a GI doctor and doesn't need this OV that was scheduled for today to see SF.

## 2013-08-13 NOTE — Telephone Encounter (Signed)
REVIEWED.  

## 2013-08-18 DIAGNOSIS — D508 Other iron deficiency anemias: Secondary | ICD-10-CM | POA: Diagnosis not present

## 2013-08-18 DIAGNOSIS — D500 Iron deficiency anemia secondary to blood loss (chronic): Secondary | ICD-10-CM | POA: Diagnosis not present

## 2013-08-22 ENCOUNTER — Institutional Professional Consult (permissible substitution): Payer: Medicare Other | Admitting: Neurology

## 2013-08-22 DIAGNOSIS — R599 Enlarged lymph nodes, unspecified: Secondary | ICD-10-CM | POA: Diagnosis not present

## 2013-08-22 DIAGNOSIS — N400 Enlarged prostate without lower urinary tract symptoms: Secondary | ICD-10-CM | POA: Diagnosis not present

## 2013-08-22 DIAGNOSIS — R911 Solitary pulmonary nodule: Secondary | ICD-10-CM | POA: Diagnosis not present

## 2013-08-22 DIAGNOSIS — C8589 Other specified types of non-Hodgkin lymphoma, extranodal and solid organ sites: Secondary | ICD-10-CM | POA: Diagnosis not present

## 2013-08-22 DIAGNOSIS — N289 Disorder of kidney and ureter, unspecified: Secondary | ICD-10-CM | POA: Diagnosis not present

## 2013-08-22 DIAGNOSIS — D509 Iron deficiency anemia, unspecified: Secondary | ICD-10-CM | POA: Diagnosis not present

## 2013-08-22 DIAGNOSIS — G3184 Mild cognitive impairment, so stated: Secondary | ICD-10-CM | POA: Diagnosis not present

## 2013-08-22 DIAGNOSIS — C8299 Follicular lymphoma, unspecified, extranodal and solid organ sites: Secondary | ICD-10-CM | POA: Diagnosis not present

## 2013-09-02 DIAGNOSIS — G3184 Mild cognitive impairment, so stated: Secondary | ICD-10-CM | POA: Diagnosis not present

## 2013-09-02 DIAGNOSIS — G259 Extrapyramidal and movement disorder, unspecified: Secondary | ICD-10-CM | POA: Diagnosis not present

## 2013-09-02 DIAGNOSIS — G2581 Restless legs syndrome: Secondary | ICD-10-CM | POA: Diagnosis not present

## 2013-09-02 DIAGNOSIS — G20A1 Parkinson's disease without dyskinesia, without mention of fluctuations: Secondary | ICD-10-CM | POA: Diagnosis not present

## 2013-09-02 DIAGNOSIS — G4733 Obstructive sleep apnea (adult) (pediatric): Secondary | ICD-10-CM | POA: Diagnosis not present

## 2013-09-02 DIAGNOSIS — G4710 Hypersomnia, unspecified: Secondary | ICD-10-CM | POA: Diagnosis not present

## 2013-09-02 DIAGNOSIS — G20 Parkinson's disease: Secondary | ICD-10-CM | POA: Diagnosis not present

## 2013-09-05 DIAGNOSIS — C8299 Follicular lymphoma, unspecified, extranodal and solid organ sites: Secondary | ICD-10-CM | POA: Diagnosis not present

## 2013-09-05 DIAGNOSIS — D508 Other iron deficiency anemias: Secondary | ICD-10-CM | POA: Diagnosis not present

## 2013-09-05 DIAGNOSIS — D500 Iron deficiency anemia secondary to blood loss (chronic): Secondary | ICD-10-CM | POA: Diagnosis not present

## 2013-09-19 DIAGNOSIS — Z862 Personal history of diseases of the blood and blood-forming organs and certain disorders involving the immune mechanism: Secondary | ICD-10-CM | POA: Diagnosis not present

## 2013-09-19 DIAGNOSIS — D509 Iron deficiency anemia, unspecified: Secondary | ICD-10-CM | POA: Diagnosis not present

## 2013-09-24 DIAGNOSIS — G2589 Other specified extrapyramidal and movement disorders: Secondary | ICD-10-CM | POA: Diagnosis not present

## 2013-09-24 DIAGNOSIS — G3184 Mild cognitive impairment, so stated: Secondary | ICD-10-CM | POA: Diagnosis not present

## 2013-09-24 DIAGNOSIS — G4733 Obstructive sleep apnea (adult) (pediatric): Secondary | ICD-10-CM | POA: Diagnosis not present

## 2013-10-02 DIAGNOSIS — G20 Parkinson's disease: Secondary | ICD-10-CM | POA: Diagnosis not present

## 2013-10-02 DIAGNOSIS — G259 Extrapyramidal and movement disorder, unspecified: Secondary | ICD-10-CM | POA: Diagnosis not present

## 2013-10-30 DIAGNOSIS — H7192 Unspecified cholesteatoma, left ear: Secondary | ICD-10-CM | POA: Diagnosis not present

## 2013-10-30 DIAGNOSIS — G2581 Restless legs syndrome: Secondary | ICD-10-CM | POA: Diagnosis not present

## 2013-10-30 DIAGNOSIS — Z8572 Personal history of non-Hodgkin lymphomas: Secondary | ICD-10-CM | POA: Diagnosis not present

## 2013-10-30 DIAGNOSIS — G7289 Other specified myopathies: Secondary | ICD-10-CM | POA: Diagnosis not present

## 2013-10-30 DIAGNOSIS — E119 Type 2 diabetes mellitus without complications: Secondary | ICD-10-CM | POA: Diagnosis not present

## 2013-10-30 DIAGNOSIS — H903 Sensorineural hearing loss, bilateral: Secondary | ICD-10-CM | POA: Diagnosis not present

## 2013-10-30 DIAGNOSIS — H905 Unspecified sensorineural hearing loss: Secondary | ICD-10-CM | POA: Diagnosis not present

## 2013-10-30 DIAGNOSIS — H908 Mixed conductive and sensorineural hearing loss, unspecified: Secondary | ICD-10-CM | POA: Diagnosis not present

## 2013-10-30 DIAGNOSIS — G4733 Obstructive sleep apnea (adult) (pediatric): Secondary | ICD-10-CM | POA: Diagnosis not present

## 2013-10-31 DIAGNOSIS — Z23 Encounter for immunization: Secondary | ICD-10-CM | POA: Diagnosis not present

## 2013-10-31 DIAGNOSIS — E1149 Type 2 diabetes mellitus with other diabetic neurological complication: Secondary | ICD-10-CM | POA: Diagnosis not present

## 2013-11-05 DIAGNOSIS — E1149 Type 2 diabetes mellitus with other diabetic neurological complication: Secondary | ICD-10-CM | POA: Diagnosis not present

## 2013-11-05 DIAGNOSIS — Z125 Encounter for screening for malignant neoplasm of prostate: Secondary | ICD-10-CM | POA: Diagnosis not present

## 2013-11-05 DIAGNOSIS — I10 Essential (primary) hypertension: Secondary | ICD-10-CM | POA: Diagnosis not present

## 2013-11-07 DIAGNOSIS — D509 Iron deficiency anemia, unspecified: Secondary | ICD-10-CM | POA: Diagnosis not present

## 2013-11-07 DIAGNOSIS — E611 Iron deficiency: Secondary | ICD-10-CM | POA: Diagnosis not present

## 2013-11-14 DIAGNOSIS — G20 Parkinson's disease: Secondary | ICD-10-CM | POA: Diagnosis not present

## 2013-11-18 DIAGNOSIS — C8290 Follicular lymphoma, unspecified, unspecified site: Secondary | ICD-10-CM | POA: Diagnosis not present

## 2013-11-18 DIAGNOSIS — Z23 Encounter for immunization: Secondary | ICD-10-CM | POA: Diagnosis not present

## 2013-11-18 DIAGNOSIS — D509 Iron deficiency anemia, unspecified: Secondary | ICD-10-CM | POA: Diagnosis not present

## 2013-11-18 DIAGNOSIS — G3184 Mild cognitive impairment, so stated: Secondary | ICD-10-CM | POA: Diagnosis not present

## 2013-11-20 DIAGNOSIS — R55 Syncope and collapse: Secondary | ICD-10-CM | POA: Diagnosis not present

## 2013-11-20 DIAGNOSIS — E118 Type 2 diabetes mellitus with unspecified complications: Secondary | ICD-10-CM | POA: Diagnosis not present

## 2013-11-20 DIAGNOSIS — R531 Weakness: Secondary | ICD-10-CM | POA: Diagnosis not present

## 2013-11-20 DIAGNOSIS — G4739 Other sleep apnea: Secondary | ICD-10-CM | POA: Diagnosis not present

## 2013-11-20 DIAGNOSIS — Z201 Contact with and (suspected) exposure to tuberculosis: Secondary | ICD-10-CM | POA: Diagnosis not present

## 2013-11-20 DIAGNOSIS — G47419 Narcolepsy without cataplexy: Secondary | ICD-10-CM | POA: Diagnosis not present

## 2013-11-20 DIAGNOSIS — R404 Transient alteration of awareness: Secondary | ICD-10-CM | POA: Diagnosis not present

## 2013-11-20 DIAGNOSIS — M4810 Ankylosing hyperostosis [Forestier], site unspecified: Secondary | ICD-10-CM | POA: Diagnosis not present

## 2013-11-21 DIAGNOSIS — G47419 Narcolepsy without cataplexy: Secondary | ICD-10-CM | POA: Diagnosis not present

## 2013-11-21 DIAGNOSIS — M4810 Ankylosing hyperostosis [Forestier], site unspecified: Secondary | ICD-10-CM | POA: Diagnosis not present

## 2013-11-21 DIAGNOSIS — R55 Syncope and collapse: Secondary | ICD-10-CM | POA: Diagnosis not present

## 2013-11-21 DIAGNOSIS — G4739 Other sleep apnea: Secondary | ICD-10-CM | POA: Diagnosis not present

## 2013-11-21 DIAGNOSIS — I517 Cardiomegaly: Secondary | ICD-10-CM | POA: Diagnosis not present

## 2013-11-21 DIAGNOSIS — E118 Type 2 diabetes mellitus with unspecified complications: Secondary | ICD-10-CM | POA: Diagnosis not present

## 2013-11-21 DIAGNOSIS — I081 Rheumatic disorders of both mitral and tricuspid valves: Secondary | ICD-10-CM | POA: Diagnosis not present

## 2013-11-21 DIAGNOSIS — I371 Nonrheumatic pulmonary valve insufficiency: Secondary | ICD-10-CM | POA: Diagnosis not present

## 2013-12-11 DIAGNOSIS — H903 Sensorineural hearing loss, bilateral: Secondary | ICD-10-CM | POA: Diagnosis not present

## 2013-12-11 DIAGNOSIS — H9193 Unspecified hearing loss, bilateral: Secondary | ICD-10-CM | POA: Diagnosis not present

## 2013-12-11 DIAGNOSIS — H6121 Impacted cerumen, right ear: Secondary | ICD-10-CM | POA: Diagnosis not present

## 2013-12-11 DIAGNOSIS — H6123 Impacted cerumen, bilateral: Secondary | ICD-10-CM | POA: Diagnosis not present

## 2014-01-26 DIAGNOSIS — R9431 Abnormal electrocardiogram [ECG] [EKG]: Secondary | ICD-10-CM | POA: Diagnosis not present

## 2014-01-26 DIAGNOSIS — Z01818 Encounter for other preprocedural examination: Secondary | ICD-10-CM | POA: Diagnosis not present

## 2014-01-26 DIAGNOSIS — Z130 Encounter for screening for diseases of the blood and blood-forming organs and certain disorders involving the immune mechanism: Secondary | ICD-10-CM | POA: Diagnosis not present

## 2014-01-26 DIAGNOSIS — H905 Unspecified sensorineural hearing loss: Secondary | ICD-10-CM | POA: Diagnosis not present

## 2014-01-26 DIAGNOSIS — R918 Other nonspecific abnormal finding of lung field: Secondary | ICD-10-CM | POA: Diagnosis not present

## 2014-01-26 DIAGNOSIS — Z8572 Personal history of non-Hodgkin lymphomas: Secondary | ICD-10-CM | POA: Diagnosis not present

## 2014-01-26 DIAGNOSIS — G20 Parkinson's disease: Secondary | ICD-10-CM | POA: Diagnosis not present

## 2014-01-26 DIAGNOSIS — H6121 Impacted cerumen, right ear: Secondary | ICD-10-CM | POA: Diagnosis not present

## 2014-01-26 DIAGNOSIS — R0989 Other specified symptoms and signs involving the circulatory and respiratory systems: Secondary | ICD-10-CM | POA: Diagnosis not present

## 2014-01-26 DIAGNOSIS — H903 Sensorineural hearing loss, bilateral: Secondary | ICD-10-CM | POA: Diagnosis not present

## 2014-01-26 DIAGNOSIS — Z79899 Other long term (current) drug therapy: Secondary | ICD-10-CM | POA: Diagnosis not present

## 2014-01-26 DIAGNOSIS — G4733 Obstructive sleep apnea (adult) (pediatric): Secondary | ICD-10-CM | POA: Diagnosis not present

## 2014-01-26 DIAGNOSIS — E119 Type 2 diabetes mellitus without complications: Secondary | ICD-10-CM | POA: Diagnosis not present

## 2014-01-26 DIAGNOSIS — G2581 Restless legs syndrome: Secondary | ICD-10-CM | POA: Diagnosis not present

## 2014-01-27 DIAGNOSIS — G4733 Obstructive sleep apnea (adult) (pediatric): Secondary | ICD-10-CM | POA: Diagnosis not present

## 2014-01-27 DIAGNOSIS — H7190 Unspecified cholesteatoma, unspecified ear: Secondary | ICD-10-CM | POA: Diagnosis not present

## 2014-01-27 DIAGNOSIS — H903 Sensorineural hearing loss, bilateral: Secondary | ICD-10-CM | POA: Diagnosis not present

## 2014-01-27 DIAGNOSIS — G2581 Restless legs syndrome: Secondary | ICD-10-CM | POA: Diagnosis not present

## 2014-01-27 DIAGNOSIS — H905 Unspecified sensorineural hearing loss: Secondary | ICD-10-CM | POA: Diagnosis not present

## 2014-01-27 DIAGNOSIS — Z9621 Cochlear implant status: Secondary | ICD-10-CM | POA: Diagnosis not present

## 2014-01-27 DIAGNOSIS — E119 Type 2 diabetes mellitus without complications: Secondary | ICD-10-CM | POA: Diagnosis not present

## 2014-01-27 DIAGNOSIS — K219 Gastro-esophageal reflux disease without esophagitis: Secondary | ICD-10-CM | POA: Diagnosis not present

## 2014-01-28 DIAGNOSIS — K219 Gastro-esophageal reflux disease without esophagitis: Secondary | ICD-10-CM | POA: Diagnosis not present

## 2014-01-28 DIAGNOSIS — E119 Type 2 diabetes mellitus without complications: Secondary | ICD-10-CM | POA: Diagnosis not present

## 2014-01-28 DIAGNOSIS — G4733 Obstructive sleep apnea (adult) (pediatric): Secondary | ICD-10-CM | POA: Diagnosis not present

## 2014-01-28 DIAGNOSIS — G2581 Restless legs syndrome: Secondary | ICD-10-CM | POA: Diagnosis not present

## 2014-01-28 DIAGNOSIS — H903 Sensorineural hearing loss, bilateral: Secondary | ICD-10-CM | POA: Diagnosis not present

## 2014-01-28 DIAGNOSIS — H7190 Unspecified cholesteatoma, unspecified ear: Secondary | ICD-10-CM | POA: Diagnosis not present

## 2014-02-20 DIAGNOSIS — Z9621 Cochlear implant status: Secondary | ICD-10-CM | POA: Diagnosis not present

## 2014-02-20 DIAGNOSIS — H903 Sensorineural hearing loss, bilateral: Secondary | ICD-10-CM | POA: Diagnosis not present

## 2014-03-05 DIAGNOSIS — G4733 Obstructive sleep apnea (adult) (pediatric): Secondary | ICD-10-CM | POA: Diagnosis not present

## 2014-03-05 DIAGNOSIS — G2581 Restless legs syndrome: Secondary | ICD-10-CM | POA: Diagnosis not present

## 2014-03-05 DIAGNOSIS — E119 Type 2 diabetes mellitus without complications: Secondary | ICD-10-CM | POA: Diagnosis not present

## 2014-03-05 DIAGNOSIS — H6121 Impacted cerumen, right ear: Secondary | ICD-10-CM | POA: Diagnosis not present

## 2014-03-05 DIAGNOSIS — C8590 Non-Hodgkin lymphoma, unspecified, unspecified site: Secondary | ICD-10-CM | POA: Diagnosis not present

## 2014-03-05 DIAGNOSIS — H903 Sensorineural hearing loss, bilateral: Secondary | ICD-10-CM | POA: Diagnosis not present

## 2014-03-05 DIAGNOSIS — G4730 Sleep apnea, unspecified: Secondary | ICD-10-CM | POA: Diagnosis not present

## 2014-03-09 DIAGNOSIS — C8290 Follicular lymphoma, unspecified, unspecified site: Secondary | ICD-10-CM | POA: Diagnosis not present

## 2014-03-10 DIAGNOSIS — Z79899 Other long term (current) drug therapy: Secondary | ICD-10-CM | POA: Diagnosis not present

## 2014-03-10 DIAGNOSIS — N2889 Other specified disorders of kidney and ureter: Secondary | ICD-10-CM | POA: Diagnosis not present

## 2014-03-10 DIAGNOSIS — R1310 Dysphagia, unspecified: Secondary | ICD-10-CM | POA: Diagnosis not present

## 2014-03-10 DIAGNOSIS — M4804 Spinal stenosis, thoracic region: Secondary | ICD-10-CM | POA: Diagnosis not present

## 2014-03-10 DIAGNOSIS — C8213 Follicular lymphoma grade II, intra-abdominal lymph nodes: Secondary | ICD-10-CM | POA: Diagnosis not present

## 2014-03-10 DIAGNOSIS — C8290 Follicular lymphoma, unspecified, unspecified site: Secondary | ICD-10-CM | POA: Diagnosis not present

## 2014-03-10 DIAGNOSIS — D509 Iron deficiency anemia, unspecified: Secondary | ICD-10-CM | POA: Diagnosis not present

## 2014-03-10 DIAGNOSIS — Z5181 Encounter for therapeutic drug level monitoring: Secondary | ICD-10-CM | POA: Diagnosis not present

## 2014-03-10 DIAGNOSIS — C8299 Follicular lymphoma, unspecified, extranodal and solid organ sites: Secondary | ICD-10-CM | POA: Diagnosis not present

## 2014-03-14 DIAGNOSIS — Z9621 Cochlear implant status: Secondary | ICD-10-CM | POA: Diagnosis not present

## 2014-03-14 DIAGNOSIS — E119 Type 2 diabetes mellitus without complications: Secondary | ICD-10-CM | POA: Diagnosis not present

## 2014-03-14 DIAGNOSIS — S0990XA Unspecified injury of head, initial encounter: Secondary | ICD-10-CM | POA: Diagnosis not present

## 2014-03-14 DIAGNOSIS — S0101XA Laceration without foreign body of scalp, initial encounter: Secondary | ICD-10-CM | POA: Diagnosis not present

## 2014-03-14 DIAGNOSIS — W1800XA Striking against unspecified object with subsequent fall, initial encounter: Secondary | ICD-10-CM | POA: Diagnosis not present

## 2014-03-14 DIAGNOSIS — Z79899 Other long term (current) drug therapy: Secondary | ICD-10-CM | POA: Diagnosis not present

## 2014-03-14 DIAGNOSIS — Z7982 Long term (current) use of aspirin: Secondary | ICD-10-CM | POA: Diagnosis not present

## 2014-03-14 DIAGNOSIS — G3189 Other specified degenerative diseases of nervous system: Secondary | ICD-10-CM | POA: Diagnosis not present

## 2014-03-17 DIAGNOSIS — G20 Parkinson's disease: Secondary | ICD-10-CM | POA: Diagnosis not present

## 2014-03-25 DIAGNOSIS — Z9621 Cochlear implant status: Secondary | ICD-10-CM | POA: Diagnosis not present

## 2014-03-25 DIAGNOSIS — Z09 Encounter for follow-up examination after completed treatment for conditions other than malignant neoplasm: Secondary | ICD-10-CM | POA: Diagnosis not present

## 2014-03-25 DIAGNOSIS — H903 Sensorineural hearing loss, bilateral: Secondary | ICD-10-CM | POA: Diagnosis not present

## 2014-04-13 ENCOUNTER — Ambulatory Visit: Payer: Medicare Other | Attending: Neurology

## 2014-04-13 DIAGNOSIS — R269 Unspecified abnormalities of gait and mobility: Secondary | ICD-10-CM | POA: Insufficient documentation

## 2014-04-13 DIAGNOSIS — R279 Unspecified lack of coordination: Secondary | ICD-10-CM | POA: Diagnosis not present

## 2014-04-13 DIAGNOSIS — R29898 Other symptoms and signs involving the musculoskeletal system: Secondary | ICD-10-CM | POA: Diagnosis not present

## 2014-04-14 NOTE — Therapy (Signed)
[facility] [address] [place], [place], [address] Phone: [phone]   Fax:  [phone]  Physical Therapy Evaluation  Patient Details  Name: Benjamin York MRN: [id number] Date of Birth: 23-Feb-1945 Referring Provider:  Eldridge Abrahams, MD  Encounter Date: 04/13/2014      PT End of Session - 04/14/14 1502    Visit Number 1   Number of Visits 17   Date for PT Re-Evaluation 06/12/14   Authorization Type Medicare-G code required   PT Start Time 1230   PT Stop Time 1315   PT Time Calculation (min) 45 min      Past Medical History  Diagnosis Date  . Diabetes mellitus   . Syncope   . Follicular lymphoma   . Anemia   . Sleep apnea   . Esophageal web     Past Surgical History  Procedure Laterality Date  . No past surgeries      There were no vitals filed for this visit.  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 He also started on Sinemet around that same time.  Pt's wife reports that pt veers left when driving, pt reports difficulty with rising from a chair, decreased walking speed. Denies freezing, reports stiffness of left leg. Pt reports that his left leg "doesn't feel right" when walking.    Patient is accompained by: Family member   Patient Stated Goals To improve ability to sit to stand, to reduce stiffness in the left leg,             [facility] PT Assessment - 04/14/14 0001    Assessment   Medical Diagnosis Parkinsonism, gait imbalance   Onset Date --  4 months ago   Precautions   Precautions Fall   Balance Screen   Has the patient fallen in the past 6 months Yes   How many times? 2  pt slipped  on the wooden floor when getting out of bed   Has the patient had a decrease in activity level because of a fear of falling?  No   Is the patient reluctant to leave their home because of a fear of falling?  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, hoping to go back   Leisure yardwork   Observation/Other Assessments   Focus on Therapeutic Outcomes (FOTO)  FS 97%   ROM / Strength   AROM / PROM / Strength --  Decreased neck AROM worse on left   Transfers   Transfers Stand to Sit  5x sit to stand 12.28  seconds--pt veers L, mild retropulsion   Ambulation/Gait   Ambulation/Gait Yes   Ambulation/Gait Assistance 5: Supervision   Ambulation Distance (Feet) 200 Feet   Assistive device None;Straight cane   Gait Pattern --  decreased arm swing L   Ambulation Surface Level;Indoor   Gait velocity 3.48 ft/sec with cane; pt's wifre reports pt is walking faster than usual  3.61 ft/sec without cane with decreased arm swing   Standardized Balance Assessment   Standardized Balance Assessment Timed Up and Go Test   Timed Up and Go Test   TUG Normal TUG   Normal TUG (seconds) 10.63   Manual TUG (seconds) 14.55   Cognitive TUG (seconds) 15.01  L loss of balance with MIN A to steady, unable to keep count          Self care x10 minutes: Explaining to pt the general plan of care for people with Parkinsonism  including the 4-8 week plan of care and 6 month screenings which are performed to assess for potential decline and need for return to therapy. Discussed the difference between Parkinsonism and Parkinson's Disease and how the physician is working to determine which the patient has based on his response to medication. As pt feels that he does not have anything wrong with him, findings of eval were discussed with pt with explanation that I am able to see the concerns that his wife and doctor are seeing.                    PT Short Term Goals - 04/14/14 1508    PT SHORT TERM GOAL #1   Title Pt will demonstrate correct performance of HEP with help of family member if needed. Target 05/15/14   PT SHORT TERM GOAL #2   Title Pt will demonstrate ability to perform TUG cognitive at any pace, without loss of balance for improved dual tasking. Target 05/15/14   PT SHORT TERM GOAL #3   Title Pt will complete FGA and appropriate STG to be set:_______________Target 05/15/14   PT SHORT TERM GOAL #4   Title Pt will perform 5x sit to stand without UE push off and without retropulsion and with equal weight bearing (no veering left) for improved safety with functional mobility.  Target 05/15/14           PT Long Term Goals - 04/14/14 1511    PT LONG TERM GOAL #1   Title Pt and wife will verbalize understanding of fall prevention strategies in home environment. Target 06/12/14   PT LONG TERM GOAL #2   Title Pt will demonstrate ability to perform 5x sit to stand in <11.5 seconds without UE support or retropulsion for decreased debiliyt. Target 06/12/14   PT LONG TERM GOAL #3   Title Pt will decrease TUG cognitive to <13.5 seconds for decreased fall risk. Target 06/12/14   PT LONG TERM GOAL #4   Title FGA goal:________________   PT LONG TERM GOAL #5   Title Pt will decrease TUG manual to <13.5 seconds for decreased fall risk. Target 06/12/14               Plan - 04/14/14 1503    Clinical  Impression Statement Pt presents with dx of atypical parkinsonism and is currently taking sinemet to determine if he has true Parkinson's Disease. He has decreased AROM of neck, decreased flexibility of the LLE, impaired balance and coordination and mild gait impairment. He will benefit from skilled PT services to address these  deficits and improve safety with functional mobility.    Pt will benefit from skilled therapeutic intervention in order to improve on the following deficits Abnormal gait;Decreased coordination;Decreased range of motion;Difficulty walking;Impaired flexibility;Decreased balance;Decreased knowledge of use of DME;Decreased mobility;Decreased strength;Impaired perceived functional ability   Rehab Potential Good   Clinical Impairments Affecting Rehab Potential impaired awareness of deficits.    PT Frequency 2x / week   PT Duration 8 weeks   PT Treatment/Interventions ADLs/Self Care Home Management;Therapeutic activities;Patient/family education;Therapeutic exercise;DME Instruction;Gait training;Balance training;Manual techniques;Stair training;Neuromuscular re-education;Functional mobility training   PT Next Visit Plan Assess FGA and write appropriate goal. HEP to include neck and LLE stretching, sit to stands, turning, and begin PWR! exercises          G-Codes - 04-30-14 1517    Functional Assessment Tool Used TUG cognitive 15.01 seconds with loss of balance, MOD A to steady and unable to keep count   Functional Limitation Mobility: Walking and moving around   Mobility: Walking and Moving Around Current Status [phone]) At least 40 percent but less than 60 percent impaired, limited or restricted   Mobility: Walking and Moving Around Goal Status [phone]) At least 20 percent but less than 40 percent impaired, limited or restricted       Problem List Patient Active Problem List   Diagnosis Date Noted  . Syncope    Delrae Sawyers, PT,DPT,NCS 04-30-2014 3:19 PM Phone  [phone] FAX [phone]         [facility] [address] [place], [place], [address] Phone: [phone]   Fax:  [phone]

## 2014-04-16 ENCOUNTER — Encounter: Payer: Self-pay | Admitting: Physical Therapy

## 2014-04-16 ENCOUNTER — Ambulatory Visit: Payer: Medicare Other | Admitting: Physical Therapy

## 2014-04-16 DIAGNOSIS — R269 Unspecified abnormalities of gait and mobility: Secondary | ICD-10-CM

## 2014-04-16 DIAGNOSIS — R29898 Other symptoms and signs involving the musculoskeletal system: Secondary | ICD-10-CM | POA: Diagnosis not present

## 2014-04-16 DIAGNOSIS — R279 Unspecified lack of coordination: Secondary | ICD-10-CM | POA: Diagnosis not present

## 2014-04-16 NOTE — Therapy (Signed)
[facility] [address] [place], [place], [address] Phone: [phone]   Fax:  [phone]  Physical Therapy Treatment  Patient Details  Name: Benjamin York MRN: [id number] Date of Birth: 1945/09/14 Referring Provider:  Eldridge Abrahams, MD  Encounter Date: 04/16/2014      PT End of Session - 04/16/14 1252    Visit Number 2   Number of Visits 17   Date for PT Re-Evaluation 06/12/14   Authorization Type Medicare-G code required   PT Start Time 1018   PT Stop Time 1101   PT Time Calculation (min) 43 min   Activity Tolerance Patient tolerated treatment well   Behavior During Therapy [facility] for tasks assessed/performed      Past Medical History  Diagnosis Date  . Diabetes mellitus   . Syncope   . Follicular lymphoma   . Anemia   . Sleep apnea   . Esophageal web     Past Surgical History  Procedure Laterality Date  . No past surgeries      There were no vitals filed for this visit.  Visit Diagnosis:  Impaired gait      Subjective Assessment - 04/16/14 1246    Subjective Wife reports pt having difficulty getting out of chair this am and lost balance but denies falls or other changes since last visit.   Patient is accompained by: Family member   Patient Stated Goals To improve ability to sit to stand, to reduce stiffness in the left leg,    Currently in Pain? No/denies            [facility] PT Assessment - 04/16/14 0940    Functional Gait  Assessment   Gait assessed  Yes   Gait Level Surface Walks 20 ft in less than 7 sec but greater than 5.5 sec, uses assistive device, slower speed, mild gait deviations, or deviates 6-10 in outside of the 12 in walkway width.   Change in Gait Speed Able to change speed, demonstrates mild gait deviations, deviates 6-10 in outside of the 12 in walkway width, or no gait deviations, unable to achieve a major change in velocity, or uses a change in velocity, or uses an  assistive device.   Gait with Horizontal Head Turns Performs head turns with moderate changes in gait velocity, slows down, deviates 10-15 in outside 12 in walkway width but recovers, can continue to walk.   Gait with Vertical Head Turns Performs task with moderate change in gait velocity, slows down, deviates 10-15 in outside 12 in walkway width but recovers, can continue to walk.   Gait and Pivot Turn Turns slowly, requires verbal cueing, or requires several small steps to catch balance following turn and stop   Step Over Obstacle Is able to step over one shoe box (4.5 in total height) but must slow down and adjust steps to clear box safely. May require verbal cueing.   Gait with Narrow Base of Support Ambulates less than 4 steps heel to toe or cannot perform without assistance.   Gait with Eyes Closed Walks 20 ft, slow speed, abnormal gait pattern, evidence for imbalance, deviates 10-15 in outside 12 in walkway width. Requires more than 9 sec to ambulate 20 ft.   Ambulating Backwards Walks 20 ft, uses assistive device, slower speed, mild gait deviations, deviates 6-10 in outside 12 in walkway width.   Steps Two feet to a stair, must use rail.   Total Score 12  PWR [facility]) - 04/16/14 1248    PWR! exercises Moves in sitting;Moves in standing   PWR! Up 20   PWR! Rock 20   PWR! Twist 20   PWR Step 20   PWR! Up 20   PWR! Rock 20   PWR! Twist 20   PWR! Step 20             PT Education - 04/16/14 1251    Education provided Yes   Education Details PWR! in Standing and Sitting-provided PWR! handouts   Person(s) Educated Patient;Spouse   Methods Explanation;Demonstration;Verbal cues;Handout   Comprehension Verbalized understanding;Need further instruction          PT Short Term Goals - 04/16/14 1328    PT SHORT TERM GOAL #1   Title Pt will demonstrate correct performance of HEP with help of family member if needed. Target 05/15/14   PT SHORT TERM  GOAL #2   Title Pt will demonstrate ability to perform TUG cognitive at any pace, without loss of balance for improved dual tasking. Target 05/15/14   PT SHORT TERM GOAL #3   Title PT will increase score on FGA to 16/30 Target 05/15/14   PT SHORT TERM GOAL #4   Title Pt will perform 5x sit to stand without UE push off and without retropulsion and with equal weight bearing (no veering left) for improved safety with functional mobility.  Target 05/15/14           PT Long Term Goals - 04/16/14 1329    PT LONG TERM GOAL #1   Title Pt and wife will verbalize understanding of fall prevention strategies in home environment. Target 06/12/14   PT LONG TERM GOAL #2   Title Pt will demonstrate ability to perform 5x sit to stand in <11.5 seconds without UE support or retropulsion for decreased debiliyt. Target 06/12/14   PT LONG TERM GOAL #3   Title Pt will decrease TUG cognitive to <13.5 seconds for decreased fall risk. Target 06/12/14   PT LONG TERM GOAL #4   Title Pt will increase FGA score to 21/30. Target 06/12/14   PT LONG TERM GOAL #5   Title Pt will decrease TUG manual to <13.5 seconds for decreased fall risk. Target 06/12/14               Plan - 04/16/14 1252    Clinical Impression Statement Initiated HEP today with pt and wife present.  Pt scored 12/30 FGA.   Pt will benefit from skilled therapeutic intervention in order to improve on the following deficits Abnormal gait;Decreased coordination;Decreased range of motion;Difficulty walking;Impaired flexibility;Decreased balance;Decreased knowledge of use of DME;Decreased mobility;Decreased strength;Impaired perceived functional ability   Rehab Potential Good   Clinical Impairments Affecting Rehab Potential impaired awareness of deficits.    PT Frequency 2x / week   PT Duration 8 weeks   PT Treatment/Interventions ADLs/Self Care Home Management;Therapeutic activities;Patient/family education;Therapeutic exercise;DME Instruction;Gait  training;Balance training;Manual techniques;Stair training;Neuromuscular re-education;Functional mobility training   PT Next Visit Plan Review PWR! standing and sitting.  sit to stands/transfer training.  Posture exercises.   Consulted and Agree with Plan of Care Patient;Family member/caregiver   Family Member Consulted wife        Problem List Patient Active Problem List   Diagnosis Date Noted  . Syncope     AldersonDonalynn Furlong 04/16/2014, 1:30 PM  [facility] [address] [place], [place], [address] Phone: [phone]   Fax:  [phone]  [name], [place] 04/16/2014 13:01 Phone: [phone] Fax: [phone]

## 2014-04-22 ENCOUNTER — Ambulatory Visit: Payer: Medicare Other | Admitting: Physical Therapy

## 2014-04-22 DIAGNOSIS — R279 Unspecified lack of coordination: Secondary | ICD-10-CM

## 2014-04-22 DIAGNOSIS — R269 Unspecified abnormalities of gait and mobility: Secondary | ICD-10-CM | POA: Diagnosis not present

## 2014-04-22 DIAGNOSIS — R29898 Other symptoms and signs involving the musculoskeletal system: Secondary | ICD-10-CM

## 2014-04-22 NOTE — Therapy (Signed)
[facility] [address] [place], [place], [address] Phone: [phone]   Fax:  [phone]  Physical Therapy Treatment  Patient Details  Name: Benjamin York MRN: [id number] Date of Birth: August 30, 1945 Referring Provider:  Eldridge Abrahams, MD  Encounter Date: 04/22/2014      PT End of Session - 04/22/14 1342    Visit Number 3   Number of Visits 17   Date for PT Re-Evaluation 06/12/14   Authorization Type Medicare-G code required   PT Start Time 1152   PT Stop Time 1232   PT Time Calculation (min) 40 min   Activity Tolerance Patient tolerated treatment well   Behavior During Therapy [facility] for tasks assessed/performed      Past Medical History  Diagnosis Date  . Diabetes mellitus   . Syncope   . Follicular lymphoma   . Anemia   . Sleep apnea   . Esophageal web     Past Surgical History  Procedure Laterality Date  . No past surgeries      There were no vitals filed for this visit.  Visit Diagnosis:  Lack of coordination  Decreased ROM of neck                       [facility] Adult PT Treatment/Exercise - 04/22/14 1337    Transfers   Transfers Sit to Stand;Stand to Sit   Sit to Stand 5: Supervision   Sit to Stand Details (indicate cue type and reason) from mat, 18' simulated couch, 16' simulated couch and 18' chair keeping hands on chair for partial stand with verbal cues for forward weight shift and to maintain forward shift all x 10 reps   Stand to Sit 5: Supervision   Posture/Postural Control   Posture/Postural Control Postural limitations   Postural Limitations Forward head;Rounded Shoulders   Posture Comments standing in door frame for upright posture x 60 sec x 2 and supine on mat with towel roll along spine x 2 minutes-provided as HEP           PWR [facility]) - 04/22/14 1336    PWR! exercises Moves in sitting;Moves in standing   PWR! Up 20   PWR! Rock 20   PWR! Twist 20   PWR Step 20   Basic 4 Flow cues for intensity and to slow movements down   PWR! Up 20   PWR! Rock 20   PWR! Twist 20   PWR! Step 20   Comments cues for intensity and to slow movements down             PT Education - 04/22/14 1341    Education provided Yes   Education Details HEP   Person(s) Educated Patient;Spouse   Methods Explanation;Demonstration;Verbal cues;Handout   Comprehension Verbalized understanding;Need further instruction          PT Short Term Goals - 04/16/14 1328    PT SHORT TERM GOAL #1   Title Pt will demonstrate correct performance of HEP with help of family member if needed. Target 05/15/14   PT SHORT TERM GOAL #2   Title Pt will demonstrate ability to perform TUG cognitive at any pace, without loss of balance for improved dual tasking. Target 05/15/14   PT SHORT TERM GOAL #3   Title PT will increase score on FGA to 16/30 Target 05/15/14   PT SHORT TERM GOAL #4   Title Pt will perform 5x sit to stand without UE push off and  without retropulsion and with equal weight bearing (no veering left) for improved safety with functional mobility.  Target 05/15/14           PT Long Term Goals - 04/16/14 1329    PT LONG TERM GOAL #1   Title Pt and wife will verbalize understanding of fall prevention strategies in home environment. Target 06/12/14   PT LONG TERM GOAL #2   Title Pt will demonstrate ability to perform 5x sit to stand in <11.5 seconds without UE support or retropulsion for decreased debiliyt. Target 06/12/14   PT LONG TERM GOAL #3   Title Pt will decrease TUG cognitive to <13.5 seconds for decreased fall risk. Target 06/12/14   PT LONG TERM GOAL #4   Title Pt will increase FGA score to 21/30. Target 06/12/14   PT LONG TERM GOAL #5   Title Pt will decrease TUG manual to <13.5 seconds for decreased fall risk. Target 06/12/14               Plan - 04/22/14 1342    Clinical Impression Statement Pt reports performing exercises at home but "hasnt  seen any improvement."  Continue PT per POC.   Pt will benefit from skilled therapeutic intervention in order to improve on the following deficits Abnormal gait;Decreased coordination;Decreased range of motion;Difficulty walking;Impaired flexibility;Decreased balance;Decreased knowledge of use of DME;Decreased mobility;Decreased strength;Impaired perceived functional ability   Rehab Potential Good   Clinical Impairments Affecting Rehab Potential impaired awareness of deficits.    PT Frequency 2x / week   PT Duration 8 weeks   PT Treatment/Interventions ADLs/Self Care Home Management;Therapeutic activities;Patient/family education;Therapeutic exercise;DME Instruction;Gait training;Balance training;Manual techniques;Stair training;Neuromuscular re-education;Functional mobility training   PT Next Visit Plan Review door frame and supine stretch;PWR! supine   Consulted and Agree with Plan of Care Patient;Family member/caregiver   Family Member Consulted wife        Problem List Patient Active Problem List   Diagnosis Date Noted  . Syncope     [name] 04/22/2014, 1:44 PM  [facility] [address] [place], [place], [address] Phone: [phone]   Fax:  [place], [place] 04/22/2014 1:44 PM Phone: [phone] Fax: [phone]

## 2014-04-22 NOTE — Patient Instructions (Signed)
Exercise for stooped posture   Stand, back to wall with head, shoulders, buttocks and heels all touching wall. Hold position 60 seconds, then take two steps away from wall. Step back to wall and correct position if needed. Repeat 5 times. Do 2 sessions per day.  http://gt2.exer.us/687  Pectoralis Stretch With Scapula Adduction: Supine (Towel Roll)   Lie with rolled towel under spine vertically. Gently squeeze shoulder blades together. Lay in this position for 5 minutes twice a day.  http://ss.exer.us/356   Copyright  VHI. All rights reserved.   Copyright  VHI. All rights reserved.

## 2014-04-23 ENCOUNTER — Ambulatory Visit: Payer: Medicare Other | Admitting: Physical Therapy

## 2014-04-23 DIAGNOSIS — R269 Unspecified abnormalities of gait and mobility: Secondary | ICD-10-CM | POA: Diagnosis not present

## 2014-04-23 DIAGNOSIS — R29898 Other symptoms and signs involving the musculoskeletal system: Secondary | ICD-10-CM | POA: Diagnosis not present

## 2014-04-23 DIAGNOSIS — R279 Unspecified lack of coordination: Secondary | ICD-10-CM | POA: Diagnosis not present

## 2014-04-23 NOTE — Therapy (Signed)
[facility] [address] [place], [place], [address] Phone: [phone]   Fax:  [phone]  Physical Therapy Treatment  Patient Details  Name: Benjamin York MRN: [id number] Date of Birth: 1945-11-05 Referring Provider:  Eldridge Abrahams, MD  Encounter Date: 04/23/2014      PT End of Session - 04/23/14 0907    Visit Number 4   Number of Visits 17   Date for PT Re-Evaluation 06/12/14   Authorization Type Medicare-G code required   PT Start Time 0804   PT Stop Time 0848   PT Time Calculation (min) 44 min   Activity Tolerance Patient tolerated treatment well   Behavior During Therapy [facility] for tasks assessed/performed      Past Medical History  Diagnosis Date  . Diabetes mellitus   . Syncope   . Follicular lymphoma   . Anemia   . Sleep apnea   . Esophageal web     Past Surgical History  Procedure Laterality Date  . No past surgeries      There were no vitals filed for this visit.  Visit Diagnosis:  Lack of coordination      Subjective Assessment - 04/23/14 0807    Subjective Tried additional HEP.  Denies falls or changes.   Patient Stated Goals To improve ability to sit to stand, to reduce stiffness in the left leg,    Currently in Pain? No/denies                         [facility] Adult PT Treatment/Exercise - 04/23/14 0904    Lumbar Exercises: Aerobic   Stationary Bike Scifit level 2.5 x all 4 extremeties x 8 minutes with rpm>70 with bouts of increased intensity           PWR [facility]) - 04/23/14 0902    PWR! exercises Moves in supine;Moves in prone   PWR! Up 20   PWR! Rock 20   PWR! Twist 20   PWR! Step 20   Comments provided as HEP   PWR! Up 20   PWR! Rock 20   PWR! Twist 20   PWR! Step 20   Comments provided as HEP-step with 1 UE only             PT Education - 04/23/14 0906    Education provided Yes   Education Details PWR! supine and prone   Person(s)  Educated Patient;Spouse   Methods Explanation;Demonstration;Verbal cues;Handout   Comprehension Verbalized understanding          PT Short Term Goals - 04/16/14 1328    PT SHORT TERM GOAL #1   Title Pt will demonstrate correct performance of HEP with help of family member if needed. Target 05/15/14   PT SHORT TERM GOAL #2   Title Pt will demonstrate ability to perform TUG cognitive at any pace, without loss of balance for improved dual tasking. Target 05/15/14   PT SHORT TERM GOAL #3   Title PT will increase score on FGA to 16/30 Target 05/15/14   PT SHORT TERM GOAL #4   Title Pt will perform 5x sit to stand without UE push off and without retropulsion and with equal weight bearing (no veering left) for improved safety with functional mobility.  Target 05/15/14           PT Long Term Goals - 04/16/14 1329    PT LONG TERM GOAL #1   Title Pt and wife will verbalize  understanding of fall prevention strategies in home environment. Target 06/12/14   PT LONG TERM GOAL #2   Title Pt will demonstrate ability to perform 5x sit to stand in <11.5 seconds without UE support or retropulsion for decreased debiliyt. Target 06/12/14   PT LONG TERM GOAL #3   Title Pt will decrease TUG cognitive to <13.5 seconds for decreased fall risk. Target 06/12/14   PT LONG TERM GOAL #4   Title Pt will increase FGA score to 21/30. Target 06/12/14   PT LONG TERM GOAL #5   Title Pt will decrease TUG manual to <13.5 seconds for decreased fall risk. Target 06/12/14               Plan - 04/23/14 0907    Clinical Impression Statement Pt continues to perform exercises at home consistently.  Decreased endurance with Scifit today.  Continue PT per POC.   Pt will benefit from skilled therapeutic intervention in order to improve on the following deficits Abnormal gait;Decreased coordination;Decreased range of motion;Difficulty walking;Impaired flexibility;Decreased balance;Decreased knowledge of use of DME;Decreased  mobility;Decreased strength;Impaired perceived functional ability   Rehab Potential Good   Clinical Impairments Affecting Rehab Potential impaired awareness of deficits.    PT Frequency 2x / week   PT Duration 8 weeks   PT Treatment/Interventions ADLs/Self Care Home Management;Therapeutic activities;Patient/family education;Therapeutic exercise;DME Instruction;Gait training;Balance training;Manual techniques;Stair training;Neuromuscular re-education;Functional mobility training   PT Next Visit Plan Review supine and prone PWR!  Discuss and provide handout for optimal community fitness options.  Treadmill-pt has a treadmill at home.   Consulted and Agree with Plan of Care Patient;Family member/caregiver   Family Member Consulted wife        Problem List Patient Active Problem List   Diagnosis Date Noted  . Syncope     [name] 04/23/2014, 9:11 AM  [facility] [address] [place], [place], [address] Phone: [phone]   Fax:  [place], [place] 04/23/2014 9:11 AM Phone: [phone] Fax: [phone]

## 2014-04-29 ENCOUNTER — Ambulatory Visit: Payer: Medicare Other | Admitting: Physical Therapy

## 2014-04-29 DIAGNOSIS — R269 Unspecified abnormalities of gait and mobility: Secondary | ICD-10-CM

## 2014-04-29 DIAGNOSIS — R29898 Other symptoms and signs involving the musculoskeletal system: Secondary | ICD-10-CM | POA: Diagnosis not present

## 2014-04-29 DIAGNOSIS — R279 Unspecified lack of coordination: Secondary | ICD-10-CM | POA: Diagnosis not present

## 2014-04-29 NOTE — Therapy (Signed)
[place] [address] [place], [place], [address] Phone: [phone]   Fax:  [phone]  Physical Therapy Treatment  Patient Details  Name: Benjamin York MRN: [id number] Date of Birth: 1945-06-19 Referring Provider:  Eldridge Abrahams, MD  Encounter Date: 04/29/2014      PT End of Session - 04/29/14 1127    Visit Number 5   Number of Visits 17   Date for PT Re-Evaluation 06/12/14   Authorization Type Medicare-G code required   PT Start Time 0934   PT Stop Time 1018   PT Time Calculation (min) 44 min   Equipment Utilized During Treatment Gait belt   Activity Tolerance Patient tolerated treatment well   Behavior During Therapy [facility] for tasks assessed/performed      Past Medical History  Diagnosis Date  . Diabetes mellitus   . Syncope   . Follicular lymphoma   . Anemia   . Sleep apnea   . Esophageal web     Past Surgical History  Procedure Laterality Date  . No past surgeries      There were no vitals filed for this visit.  Visit Diagnosis:  Lack of coordination  Impaired gait      Subjective Assessment - 04/29/14 0937    Subjective Had one fall over weekend while getting up from chair.  Feels like his ankle twisted causing fall but denies injury.   Patient is accompained by: Family member   Patient Stated Goals To improve ability to sit to stand, to reduce stiffness in the left leg,    Currently in Pain? No/denies                         [facility] Adult PT Treatment/Exercise - 04/29/14 1120    Transfers   Transfers Sit to Stand;Stand to Sit   Sit to Stand 5: Supervision   Stand to Sit 5: Supervision   Ambulation/Gait   Ambulation/Gait Yes   Ambulation/Gait Assistance 5: Supervision   Ambulation/Gait Assistance Details cues for heel strike, arm swing and cadence-decreased stride length on L   Ambulation Distance (Feet) 120 Feet  twice then 3' on treadmill   Assistive  device None   Gait Pattern Step-through pattern;Decreased step length - left;Decreased dorsiflexion - left  decreased cadence and decreased arm swing   Ambulation Surface Level;Indoor           PWR [facility]) - 04/29/14 1119    PWR! exercises Moves in [place];Moves in supine;Moves in prone   PWR! Up 20   PWR! Rock 20   PWR! Twist 20   PWR! Step 20   Comments pt unable to come fully upright at trunk for step so instructed to only do step portion with alternating LE's   PWR! Up 20   PWR! Rock 20   PWR! Twist 20   PWR! Step 20   Comments verbal cues for intensity and technique   PWR! Up 20   PWR! Rock 20   PWR! Twist 20   PWR! Step 20   Comments verbal cues for intensity and technique             PT Education - 04/29/14 1125    Education Details PWR! quadriped   Person(s) Educated Patient;Spouse   Methods Explanation;Demonstration;Verbal cues;Handout   Comprehension Verbalized understanding          PT Short Term Goals - 04/16/14 1328    PT SHORT TERM GOAL #1  Title Pt will demonstrate correct performance of HEP with help of family member if needed. Target 05/15/14   PT SHORT TERM GOAL #2   Title Pt will demonstrate ability to perform TUG cognitive at any pace, without loss of balance for improved dual tasking. Target 05/15/14   PT SHORT TERM GOAL #3   Title PT will increase score on FGA to 16/30 Target 05/15/14   PT SHORT TERM GOAL #4   Title Pt will perform 5x sit to stand without UE push off and without retropulsion and with equal weight bearing (no veering left) for improved safety with functional mobility.  Target 05/15/14           PT Long Term Goals - 04/16/14 1329    PT LONG TERM GOAL #1   Title Pt and wife will verbalize understanding of fall prevention strategies in home environment. Target 06/12/14   PT LONG TERM GOAL #2   Title Pt will demonstrate ability to perform 5x sit to stand in <11.5 seconds without UE support or retropulsion for decreased  debiliyt. Target 06/12/14   PT LONG TERM GOAL #3   Title Pt will decrease TUG cognitive to <13.5 seconds for decreased fall risk. Target 06/12/14   PT LONG TERM GOAL #4   Title Pt will increase FGA score to 21/30. Target 06/12/14   PT LONG TERM GOAL #5   Title Pt will decrease TUG manual to <13.5 seconds for decreased fall risk. Target 06/12/14               Plan - 04/29/14 1127    Clinical Impression Statement Pt reports perfoming exercises at home.  Pt says he can not see a difference but wife reports an improvement in mobility since starting PT.  Continue PT per POC.   Pt will benefit from skilled therapeutic intervention in order to improve on the following deficits Abnormal gait;Decreased coordination;Decreased range of motion;Difficulty walking;Impaired flexibility;Decreased balance;Decreased knowledge of use of DME;Decreased mobility;Decreased strength;Impaired perceived functional ability   Rehab Potential Good   Clinical Impairments Affecting Rehab Potential impaired awareness of deficits.    PT Frequency 2x / week   PT Duration 8 weeks   PT Treatment/Interventions ADLs/Self Care Home Management;Therapeutic activities;Patient/family education;Therapeutic exercise;DME Instruction;Gait training;Balance training;Manual techniques;Stair training;Neuromuscular re-education;Functional mobility training   PT Next Visit Plan Review quadriped PWR!  Gait on treadmill and ground working on L stride and arm swing.  Discuss optimal community fitness options/plan (walking, HEP, cardio)   Consulted and Agree with Plan of Care Patient;Family member/caregiver   Family Member Consulted wife        Problem List Patient Active Problem List   Diagnosis Date Noted  . Syncope     Benjamin York 04/29/2014, 11:30 AM  [place] [address] [place], [place], [address] Phone: [phone]   Fax:  [place], [place] 04/29/2014 11:30 AM Phone: [phone] Fax: [phone]

## 2014-04-30 ENCOUNTER — Ambulatory Visit: Payer: Medicare Other | Admitting: Physical Therapy

## 2014-04-30 DIAGNOSIS — R269 Unspecified abnormalities of gait and mobility: Secondary | ICD-10-CM | POA: Diagnosis not present

## 2014-04-30 DIAGNOSIS — R29898 Other symptoms and signs involving the musculoskeletal system: Secondary | ICD-10-CM | POA: Diagnosis not present

## 2014-04-30 DIAGNOSIS — R279 Unspecified lack of coordination: Secondary | ICD-10-CM

## 2014-04-30 NOTE — Patient Instructions (Addendum)
[employer]  *Exercises from [employer].  Remember to make BIG movements when doing each exercise and have best posture possible.  *Cardio-Three times a week for 30 minutes each time.  Can use recumbent bike or treadmill.  Can start out with 10 minutes and add a minute each time until you work up to 30 minutes at a time.  If using recumbent bike, watch you speed and try to do bouts of increased intensity/speed.  Josephine Cables times a week for 30 minutes each time.  Remember to swing your arms and have your best posture.  Can start out with 10 minutes and add a minute each time until you work up to 30 minutes at a time.  Remember that your heel should hit the ground first and take big steps.

## 2014-04-30 NOTE — Therapy (Signed)
[facility] [address] [place], [place], [address] Phone: [phone]   Fax:  [phone]  Physical Therapy Treatment  Patient Details  Name: Benjamin York MRN: [id number] Date of Birth: 1945-09-13 Referring Provider:  Eldridge Abrahams, MD  Encounter Date: 04/30/2014      PT End of Session - 04/30/14 1844    Visit Number 6   Number of Visits 17   Date for PT Re-Evaluation 06/12/14   Authorization Type Medicare-G code required   PT Start Time 0848   PT Stop Time 0932   PT Time Calculation (min) 44 min   Equipment Utilized During Treatment Gait belt   Activity Tolerance Patient tolerated treatment well   Behavior During Therapy [facility] for tasks assessed/performed      Past Medical History  Diagnosis Date  . Diabetes mellitus   . Syncope   . Follicular lymphoma   . Anemia   . Sleep apnea   . Esophageal web     Past Surgical History  Procedure Laterality Date  . No past surgeries      There were no vitals filed for this visit.  Visit Diagnosis:  Lack of coordination  Impaired gait      Subjective Assessment - 04/30/14 1839    Subjective Denies falls or changes.  Reports he felt better after last PT treatment and had more energy.   Patient is accompained by: Family member   Patient Stated Goals To improve ability to sit to stand, to reduce stiffness in the left leg,    Currently in Pain? No/denies                         [facility] Adult PT Treatment/Exercise - 04/30/14 0923    Transfers   Transfers Sit to Stand;Stand to Sit   Sit to Stand 5: Supervision   Stand to Sit 5: Supervision   Ambulation/Gait   Ambulation/Gait Yes   Ambulation/Gait Assistance 5: Supervision   Ambulation/Gait Assistance Details cues for L step length, heel strike, armswing and cadence;pt with difficulty coordinating walking poles and needed max cues;gait with PTA holding end of walking poles to assist  with arm swing;also gait on treadmill x 6 minutes at 2.5-3.0 mph working on same and x 2 minutes at 1.2 mph kicking blue therapy ball to promote L step length   Ambulation Distance (Feet) 600 Feet  plus   Assistive device None;Other (Comment)  vs walking poles   Gait Pattern Step-through pattern;Decreased step length - left;Decreased dorsiflexion - left  decreased arm swing   Ambulation Surface Level;Indoor   Lumbar Exercises: Aerobic   Stationary Bike Scifit level 2.5 x all 4 extremeties x 8 minutes with rpm>80           PWR [facility]) - 04/29/14 1119    PWR! exercises Moves in [place];Moves in supine;Moves in prone   PWR! Up 20   PWR! Rock 20   PWR! Twist 20   PWR! Step 20   Comments pt unable to come fully upright at trunk for step so instructed to only do step portion with alternating LE's   PWR! Up 20   PWR! Rock 20   PWR! Twist 20   PWR! Step 20   Comments verbal cues for intensity and technique   PWR! Up 20   PWR! Rock 20   PWR! Twist 20   PWR! Step 20   Comments verbal cues for intensity and technique  PT Education - 04/30/14 1843    Education provided Yes   Education Details Opt[employer] community fitness for PD, importance of continued exercise   Person(s) Educated Patient;Spouse   Methods Explanation;Handout   Comprehension Verbalized understanding          PT Short Term Goals - 04/16/14 1328    PT SHORT TERM GOAL #1   Title Pt will demonstrate correct performance of HEP with help of family member if needed. Target 05/15/14   PT SHORT TERM GOAL #2   Title Pt will demonstrate ability to perform TUG cognitive at any pace, without loss of balance for improved dual tasking. Target 05/15/14   PT SHORT TERM GOAL #3   Title PT will increase score on FGA to 16/30 Target 05/15/14   PT SHORT TERM GOAL #4   Title Pt will perform 5x sit to stand without UE push off and without retropulsion and with equal weight bearing (no veering left) for improved safety  with functional mobility.  Target 05/15/14           PT Long Term Goals - 04/16/14 1329    PT LONG TERM GOAL #1   Title Pt and wife will verbalize understanding of fall prevention strategies in home environment. Target 06/12/14   PT LONG TERM GOAL #2   Title Pt will demonstrate ability to perform 5x sit to stand in <11.5 seconds without UE support or retropulsion for decreased debiliyt. Target 06/12/14   PT LONG TERM GOAL #3   Title Pt will decrease TUG cognitive to <13.5 seconds for decreased fall risk. Target 06/12/14   PT LONG TERM GOAL #4   Title Pt will increase FGA score to 21/30. Target 06/12/14   PT LONG TERM GOAL #5   Title Pt will decrease TUG manual to <13.5 seconds for decreased fall risk. Target 06/12/14               Plan - 04/30/14 1845    Clinical Impression Statement Pt reports increased energy after treament and exercise.  Difficulty coordinating walking poles.  Continues with decreased arm swing with gait.  Continue PT per POC.   Pt will benefit from skilled therapeutic intervention in order to improve on the following deficits Abnormal gait;Decreased coordination;Decreased range of motion;Difficulty walking;Impaired flexibility;Decreased balance;Decreased knowledge of use of DME;Decreased mobility;Decreased strength;Impaired perceived functional ability   Rehab Potential Good   Clinical Impairments Affecting Rehab Potential impaired awareness of deficits.    PT Frequency 2x / week   PT Duration 8 weeks   PT Treatment/Interventions ADLs/Self Care Home Management;Therapeutic activities;Patient/family education;Therapeutic exercise;DME Instruction;Gait training;Balance training;Manual techniques;Stair training;Neuromuscular re-education;Functional mobility training   PT Next Visit Plan Continue gait.  Weight shifting. Dynamic balance.   Consulted and Agree with Plan of Care Patient;Family member/caregiver   Family Member Consulted wife        Problem  List Patient Active Problem List   Diagnosis Date Noted  . Syncope     [name] 04/30/2014, 6:50 PM  [facility] [address] [place], [place], [address] Phone: [phone]   Fax:  [place], [place] 04/30/2014 6:50 PM Phone: [phone] Fax: [phone]

## 2014-05-01 DIAGNOSIS — E119 Type 2 diabetes mellitus without complications: Secondary | ICD-10-CM | POA: Diagnosis not present

## 2014-05-01 DIAGNOSIS — I10 Essential (primary) hypertension: Secondary | ICD-10-CM | POA: Diagnosis not present

## 2014-05-01 DIAGNOSIS — Z125 Encounter for screening for malignant neoplasm of prostate: Secondary | ICD-10-CM | POA: Diagnosis not present

## 2014-05-01 DIAGNOSIS — R5382 Chronic fatigue, unspecified: Secondary | ICD-10-CM | POA: Diagnosis not present

## 2014-05-05 ENCOUNTER — Ambulatory Visit: Payer: Medicare Other | Attending: Neurology

## 2014-05-05 DIAGNOSIS — R29898 Other symptoms and signs involving the musculoskeletal system: Secondary | ICD-10-CM | POA: Insufficient documentation

## 2014-05-05 DIAGNOSIS — R279 Unspecified lack of coordination: Secondary | ICD-10-CM

## 2014-05-05 DIAGNOSIS — R269 Unspecified abnormalities of gait and mobility: Secondary | ICD-10-CM

## 2014-05-05 NOTE — Therapy (Signed)
[facility] [address] [place], [place], [address] Phone: [phone]   Fax:  [phone]  Physical Therapy Treatment  Patient Details  Name: Benjamin York MRN: [id number] Date of Birth: April 09, 1945 Referring Provider:  Eldridge Abrahams, MD  Encounter Date: 05/05/2014      PT End of Session - 05/05/14 1156    Visit Number 7   Number of Visits 17   Date for PT Re-Evaluation 06/12/14   Authorization Type Medicare-G code required   PT Start Time 1102   PT Stop Time 1145   PT Time Calculation (min) 43 min      Past Medical History  Diagnosis Date  . Diabetes mellitus   . Syncope   . Follicular lymphoma   . Anemia   . Sleep apnea   . Esophageal web     Past Surgical History  Procedure Laterality Date  . No past surgeries      There were no vitals filed for this visit.  Visit Diagnosis:  Lack of coordination  Impaired gait      Subjective Assessment - 05/05/14 1103    Subjective Denies falls or changes.  .   Currently in Pain? No/denies      Gait training--without assistive device throughout session. x21' with emphasis on arm swing with intermittent CGA due to festination Attempted using walking poles x400' with therapist demo, maximal verbal cues, hand over hand assist, counting--pt unable to coordinate these. Then performed using therapist holding opposite end of poles to promote increased and properly coordinated reciprocal arm swing x600'.  Ambulating (power walking) with quick stops on verbal command initially with pt festinating, then decreased festination with pt simply having anterior instability upon quick stop but able to self correct.  Quick turns on command with pt powerwalking and turning toward the right or left with subsequent "stop". Pt performed this well. Requires verbal cues to increase walking speed.  Stepping over 2"x4" obstacles without assistive with left foot catching on  obstacle initially, performed seated dorsiflexion x60 then instructed to dorsiflex foot when stepping over obstacle and able to perform without catching toe. Progressed to stepping forward over 6" and 9" hurdles with supervision.  Lateral stepping over hurdles with increased difficulty with lateral weight shift, decreased step length noted, initially required many small steps to clear the obstacle. Progressed to larger steps at end of session with maximal verbal cues and therapist demo.                              PT Short Term Goals - 04/16/14 1328    PT SHORT TERM GOAL #1   Title Pt will demonstrate correct performance of HEP with help of family member if needed. Target 05/15/14   PT SHORT TERM GOAL #2   Title Pt will demonstrate ability to perform TUG cognitive at any pace, without loss of balance for improved dual tasking. Target 05/15/14   PT SHORT TERM GOAL #3   Title PT will increase score on FGA to 16/30 Target 05/15/14   PT SHORT TERM GOAL #4   Title Pt will perform 5x sit to stand without UE push off and without retropulsion and with equal weight bearing (no veering left) for improved safety with functional mobility.  Target 05/15/14           PT Long Term Goals - 04/16/14 1329    PT LONG TERM GOAL #1  Title Pt and wife will verbalize understanding of fall prevention strategies in home environment. Target 06/12/14   PT LONG TERM GOAL #2   Title Pt will demonstrate ability to perform 5x sit to stand in <11.5 seconds without UE support or retropulsion for decreased debiliyt. Target 06/12/14   PT LONG TERM GOAL #3   Title Pt will decrease TUG cognitive to <13.5 seconds for decreased fall risk. Target 06/12/14   PT LONG TERM GOAL #4   Title Pt will increase FGA score to 21/30. Target 06/12/14   PT LONG TERM GOAL #5   Title Pt will decrease TUG manual to <13.5 seconds for decreased fall risk. Target 06/12/14               Plan - 05/05/14 1156     Clinical Impression Statement Pt appears to have notable cognitive impairment but is in denial. Will attempt to make speech referral next visit but it is unlikely that pt will be williing to participate in cognitive therapy. Pt is making significant progress with mobility Continues to have decreased left arm swing and left step length. Conitnue per POC   PT Next Visit Plan dynamic balance training; more trials of lateral stepping over obstacles, LUE/LLE larger amplitude movement        Problem List Patient Active Problem List   Diagnosis Date Noted  . Syncope    Delrae Sawyers, PT,DPT,NCS 05/05/2014 12:18 PM Phone [phone] FAX [phone]         [facility] [address] [place], [place], [address] Phone: [phone]   Fax:  [phone]

## 2014-05-07 ENCOUNTER — Ambulatory Visit: Payer: Medicare Other

## 2014-05-07 DIAGNOSIS — R29898 Other symptoms and signs involving the musculoskeletal system: Secondary | ICD-10-CM | POA: Diagnosis not present

## 2014-05-07 DIAGNOSIS — R279 Unspecified lack of coordination: Secondary | ICD-10-CM | POA: Diagnosis not present

## 2014-05-07 DIAGNOSIS — R269 Unspecified abnormalities of gait and mobility: Secondary | ICD-10-CM

## 2014-05-07 NOTE — Therapy (Signed)
[facility] [address] [place], [place], [address] Phone: [phone]   Fax:  [phone]  Physical Therapy Treatment  Patient Details  Name: Benjamin York MRN: [id number] Date of Birth: [birth year] Referring Provider:  Eldridge Abrahams, MD  Encounter Date: 05/07/2014      PT End of Session - 05/07/14 1710    Visit Number 8   Number of Visits 17   Date for PT Re-Evaluation 06/12/14   Authorization Type Medicare-G code required   PT Start Time 7062   PT Stop Time 1530   PT Time Calculation (min) 45 min      Past Medical History  Diagnosis Date  . Diabetes mellitus   . Syncope   . Follicular lymphoma   . Anemia   . Sleep apnea   . Esophageal web     Past Surgical History  Procedure Laterality Date  . No past surgeries      There were no vitals filed for this visit.  Visit Diagnosis:  Lack of coordination  Impaired gait      Subjective Assessment - 05/07/14 1448    Subjective Denies falls or changes.  .Wife reports he almost fell while performing a standing posture exercise against wall. Therapist recommended using a chair in front of him for UE support with this to decrease risk of falls.   Currently in Pain? No/denies     4 laps of hurdle side stepping with MINA to steady   Standing on airex with feet apart ball toss with small ball, then progressed to around the world toss, then progressed to perform with red physioball with intermittent CGA to steady  Standing on doubled compliant mat lateral rocking with wide base of support to hit target on cabinet, then progressed to tap across midline  Standing on doubled compliant mat at counter: modified quadruped, then stand tall and reach target overhead on tip toes to form fatigue, then alternate overhead tapping to target on cabine with heel raise  Large amplitude marching with reciprocal overahead armswing with 3# hand weights and mirror+  therapist demo  Gait training: powerwalking with emphasis on reciprocal arm swing, then with poles with therapist assisting the opposite end for increased armswing                             PT Short Term Goals - 04/16/14 1328    PT SHORT TERM GOAL #1   Title Pt will demonstrate correct performance of HEP with help of family member if needed. Target 05/15/14   PT SHORT TERM GOAL #2   Title Pt will demonstrate ability to perform TUG cognitive at any pace, without loss of balance for improved dual tasking. Target 05/15/14   PT SHORT TERM GOAL #3   Title PT will increase score on FGA to 16/30 Target 05/15/14   PT SHORT TERM GOAL #4   Title Pt will perform 5x sit to stand without UE push off and without retropulsion and with equal weight bearing (no veering left) for improved safety with functional mobility.  Target 05/15/14           PT Long Term Goals - 04/16/14 1329    PT LONG TERM GOAL #1   Title Pt and wife will verbalize understanding of fall prevention strategies in home environment. Target 06/12/14   PT LONG TERM GOAL #2   Title Pt will demonstrate ability to perform 5x sit to  stand in <11.5 seconds without UE support or retropulsion for decreased debiliyt. Target 06/12/14   PT LONG TERM GOAL #3   Title Pt will decrease TUG cognitive to <13.5 seconds for decreased fall risk. Target 06/12/14   PT LONG TERM GOAL #4   Title Pt will increase FGA score to 21/30. Target 06/12/14   PT LONG TERM GOAL #5   Title Pt will decrease TUG manual to <13.5 seconds for decreased fall risk. Target 06/12/14               Plan - 05/07/14 1710    Clinical Impression Statement Pt now agreeing to speech therapy eval and treat if indicated. Will request order from physician. Pt demonstrated improved coordination with stepping over obstacles and weight shifting today. Continue per plan of care.    PT Next Visit Plan check short term goals, add visits as needed. See if ST order  has been entered        Problem List Patient Active Problem List   Diagnosis Date Noted  . Syncope     Delrae Sawyers, PT,DPT,NCS 05/07/2014 5:14 PM Phone [phone] FAX [phone]          [facility] [address] [place], [place], [address] Phone: [phone]   Fax:  [phone]

## 2014-05-12 ENCOUNTER — Ambulatory Visit: Payer: Medicare Other

## 2014-05-12 DIAGNOSIS — R29898 Other symptoms and signs involving the musculoskeletal system: Secondary | ICD-10-CM | POA: Diagnosis not present

## 2014-05-12 DIAGNOSIS — R269 Unspecified abnormalities of gait and mobility: Secondary | ICD-10-CM

## 2014-05-12 DIAGNOSIS — R279 Unspecified lack of coordination: Secondary | ICD-10-CM

## 2014-05-12 NOTE — Therapy (Signed)
[facility] [address] [place], [place], [address] Phone: [phone]   Fax:  [phone]  Physical Therapy Treatment  Patient Details  Name: Benjamin York MRN: [id number] Date of Birth: 22-Apr-1945 Referring Provider:  Eldridge Abrahams, MD  Encounter Date: 05/12/2014      PT End of Session - 05/12/14 1204    Visit Number 9   Number of Visits 17   Date for PT Re-Evaluation 06/12/14   Authorization Type Medicare-G code required   PT Start Time 1024   PT Stop Time 1103   PT Time Calculation (min) 39 min      Past Medical History  Diagnosis Date  . Diabetes mellitus   . Syncope   . Follicular lymphoma   . Anemia   . Sleep apnea   . Esophageal web     Past Surgical History  Procedure Laterality Date  . No past surgeries      There were no vitals filed for this visit.  Visit Diagnosis:  Lack of coordination  Impaired gait      Subjective Assessment - 05/12/14 1033    Subjective Pt reports 2 falls since he was here last. These occurred two days in a row while doing yardwork on a slope. Reports he was not injured that he fell to his knes.             [facility] PT Assessment - 05/12/14 0001    Functional Gait  Assessment   Gait assessed  Yes   Gait Level Surface Walks 20 ft in less than 5.5 sec, no assistive devices, good speed, no evidence for imbalance, normal gait pattern, deviates no more than 6 in outside of the 12 in walkway width.   Change in Gait Speed Able to smoothly change walking speed without loss of balance or gait deviation. Deviate no more than 6 in outside of the 12 in walkway width.   Gait with Horizontal Head Turns Performs head turns smoothly with no change in gait. Deviates no more than 6 in outside 12 in walkway width   Gait with Vertical Head Turns Performs task with slight change in gait velocity (eg, minor disruption to smooth gait path), deviates 6 - 10 in outside 12 in  walkway width or uses assistive device   Gait and Pivot Turn Pivot turns safely within 3 sec and stops quickly with no loss of balance.   Step Over Obstacle Is able to step over one shoe box (4.5 in total height) without changing gait speed. No evidence of imbalance.   Gait with Narrow Base of Support Ambulates less than 4 steps heel to toe or cannot perform without assistance.   Gait with Eyes Closed Walks 20 ft, uses assistive device, slower speed, mild gait deviations, deviates 6-10 in outside 12 in walkway width. Ambulates 20 ft in less than 9 sec but greater than 7 sec.   Ambulating Backwards Walks 20 ft, uses assistive device, slower speed, mild gait deviations, deviates 6-10 in outside 12 in walkway width.   Steps Two feet to a stair, must use rail.   Total Score 21      Self care: thorough discussion of pt's recent falls and ideas for activity modification (ie: pulling weeds and trimming flowers while seated in a chair); as well as working towards increased safety awareness "ie asking 'is this safe or am I unsteady' before performing outdoor or new tasks.  Neuro re-ed: checked TUG cognitive and FGA .  See goals section for details.                         PT Short Term Goals - 05/12/14 1035    PT SHORT TERM GOAL #1   Title Pt will demonstrate correct performance of HEP with help of family member if needed. Target 05/15/14   PT SHORT TERM GOAL #2   Title Pt will demonstrate ability to perform TUG cognitive at any pace, without loss of balance for improved dual tasking. Target 05/15/14   Status Achieved  16.53 seconds without loss of balance, with good attention to cognitive task.   PT SHORT TERM GOAL #3   Title PT will increase score on FGA to 16/30 Target 05/15/14   Status Achieved  21/30 on 05/12/14   PT SHORT TERM GOAL #4   Title Pt will perform 5x sit to stand without UE push off and without retropulsion and with equal weight bearing (no veering left) for  improved safety with functional mobility.  Target 05/15/14   Status Partially Met  performs without UE push off without retropulsion but continues to veer left (increased L weight bearing and unable to correct with verbal cues)           PT Long Term Goals - 04/16/14 1329    PT LONG TERM GOAL #1   Title Pt and wife will verbalize understanding of fall prevention strategies in home environment. Target 06/12/14   PT LONG TERM GOAL #2   Title Pt will demonstrate ability to perform 5x sit to stand in <11.5 seconds without UE support or retropulsion for decreased debiliyt. Target 06/12/14   PT LONG TERM GOAL #3   Title Pt will decrease TUG cognitive to <13.5 seconds for decreased fall risk. Target 06/12/14   PT LONG TERM GOAL #4   Title Pt will increase FGA score to 21/30. Target 06/12/14   PT LONG TERM GOAL #5   Title Pt will decrease TUG manual to <13.5 seconds for decreased fall risk. Target 06/12/14               Plan - 05/12/14 1205    Clinical Impression Statement Pt met most STGs today, demonstrated significant improvement in FGA score, no longer veers L with ambulation but continues to veer left with sit to stand with unequal weight bearing. Noted to have pelvic obliquity with the right side elevated. Working on pelvic mobility may improve equality of weight bearing to decrase risk of falls. In addition, pt reported 2 falls while doing yardwork on a sloped surface.  Will continue to work on safety awareness and task modification ideas to decrease risk of falls.     PT Next Visit Plan Check on ST order. Family contacted MD at [facility] requesting an order. Check HEP (STG), update as needed, work on improved pelvic mobilty in all directoins and specifically decreasing pelvic obilquity with stretching/strengthining, balance on ramp        Problem List Patient Active Problem List   Diagnosis Date Noted  . Syncope     , PT,DPT,NCS 05/12/2014 12:15 PM Phone  [phone] FAX [phone]         [facility] [address] [place], [place], [address] Phone: [phone]   Fax:  [phone]      

## 2014-05-14 ENCOUNTER — Ambulatory Visit: Payer: Medicare Other | Admitting: Physical Therapy

## 2014-05-19 ENCOUNTER — Ambulatory Visit: Payer: Medicare Other

## 2014-05-19 DIAGNOSIS — R279 Unspecified lack of coordination: Secondary | ICD-10-CM

## 2014-05-19 DIAGNOSIS — R29898 Other symptoms and signs involving the musculoskeletal system: Secondary | ICD-10-CM | POA: Diagnosis not present

## 2014-05-19 DIAGNOSIS — R269 Unspecified abnormalities of gait and mobility: Secondary | ICD-10-CM | POA: Diagnosis not present

## 2014-05-19 NOTE — Therapy (Signed)
[facility] [address] [place], [place], [address] Phone: [phone]   Fax:  [phone]  Physical Therapy Treatment  Patient Details  Name: Benjamin York MRN: [id number] Date of Birth: 04-21-1945 Referring Provider:  Eldridge Abrahams, MD  Encounter Date: 05/19/2014      PT End of Session - 05/19/14 1223    Visit Number 10   Number of Visits 17   Date for PT Re-Evaluation 06/12/14   Authorization Type Medicare-G code required   PT Start Time 1105   PT Stop Time 1145   PT Time Calculation (min) 40 min   Equipment Utilized During Treatment Gait belt      Past Medical History  Diagnosis Date  . Diabetes mellitus   . Syncope   . Follicular lymphoma   . Anemia   . Sleep apnea   . Esophageal web     Past Surgical History  Procedure Laterality Date  . No past surgeries      There were no vitals filed for this visit.  Visit Diagnosis:  Lack of coordination      Subjective Assessment - 05/19/14 1111    Subjective Pt did some gardening, used a chair for part of it. No falls.   Currently in Pain? No/denies     -Reviewed [employer]! Basic 4 in standing with correct performance demonstrated. -left sidelying facilitation of pelvic mobility; passive, then active assisted, then active without assist or facilitation with good pelvic mobility and dissociation noted. -balance training on ramp downslope and incline with intermittent UE support and intermittent CGA: solid surface progressing to compliant surface. Included: standing feet together eyes closed, then standing feet together eyes closed with head turns, then reaching forward task with trunk rotation, then reaching forward and downward with feet apart, then reaching to the floor + trunk rotation. No loss of balance.                                PT Short Term Goals - 05/19/14 1224    PT SHORT TERM GOAL #1   Title Pt will  demonstrate correct performance of HEP with help of family member if needed. Target 05/15/14   Status Achieved   PT SHORT TERM GOAL #2   Title Pt will demonstrate ability to perform TUG cognitive at any pace, without loss of balance for improved dual tasking. Target 05/15/14   Status Achieved  16.53 seconds without loss of balance, with good attention to cognitive task.   PT SHORT TERM GOAL #3   Title PT will increase score on FGA to 16/30 Target 05/15/14   Status Achieved  21/30 on 05/12/14   PT SHORT TERM GOAL #4   Title Pt will perform 5x sit to stand without UE push off and without retropulsion and with equal weight bearing (no veering left) for improved safety with functional mobility.  Target 05/15/14   Status Partially Met  performs without UE push off without retropulsion but continues to veer left (increased L weight bearing and unable to correct with verbal cues)           PT Long Term Goals - 04/16/14 1329    PT LONG TERM GOAL #1   Title Pt and wife will verbalize understanding of fall prevention strategies in home environment. Target 06/12/14   PT LONG TERM GOAL #2   Title Pt will demonstrate ability to perform 5x sit to stand  in <11.5 seconds without UE support or retropulsion for decreased debiliyt. Target 06/12/14   PT LONG TERM GOAL #3   Title Pt will decrease TUG cognitive to <13.5 seconds for decreased fall risk. Target 06/12/14   PT LONG TERM GOAL #4   Title Pt will increase FGA score to 21/30. Target 06/12/14   PT LONG TERM GOAL #5   Title Pt will decrease TUG manual to <13.5 seconds for decreased fall risk. Target 06/12/14               Plan - 2014-06-07 1223    Clinical Impression Statement Pt demonstrated fair balance on incline and down slope today with intermittent UE support also demonstrated good performance of HEP. Will contact MD again to try to attain a ST referral.   PT Next Visit Plan See if ST order came through. Continue per POC          G-Codes  - 07-Jun-2014 1225    Functional Assessment Tool Used FGA 21/30   Functional Limitation Mobility: Walking and moving around   Mobility: Walking and Moving Around Current Status [phone]) At least 20 percent but less than 40 percent impaired, limited or restricted   Mobility: Walking and Moving Around Goal Status [phone]) At least 1 percent but less than 20 percent impaired, limited or restricted      Problem List Patient Active Problem List   Diagnosis Date Noted  . Syncope    Delrae Sawyers, PT,DPT,NCS 2014-06-07 12:29 PM Phone [phone] FAX [phone]         [facility] [address] [place], [place], [address] Phone: [phone]   Fax:  [phone]

## 2014-05-21 ENCOUNTER — Ambulatory Visit: Payer: Medicare Other

## 2014-06-03 ENCOUNTER — Ambulatory Visit: Payer: Medicare Other | Attending: Neurology | Admitting: Physical Therapy

## 2014-06-03 DIAGNOSIS — R269 Unspecified abnormalities of gait and mobility: Secondary | ICD-10-CM | POA: Diagnosis not present

## 2014-06-03 DIAGNOSIS — R41841 Cognitive communication deficit: Secondary | ICD-10-CM | POA: Diagnosis not present

## 2014-06-03 DIAGNOSIS — R1312 Dysphagia, oropharyngeal phase: Secondary | ICD-10-CM | POA: Insufficient documentation

## 2014-06-03 DIAGNOSIS — R279 Unspecified lack of coordination: Secondary | ICD-10-CM | POA: Diagnosis not present

## 2014-06-03 DIAGNOSIS — R490 Dysphonia: Secondary | ICD-10-CM | POA: Insufficient documentation

## 2014-06-03 NOTE — Therapy (Signed)
[facility] [address] [place], [place], [address] Phone: [phone]   Fax:  [phone]  Physical Therapy Treatment  Patient Details  Name: Benjamin York MRN: [id number] Date of Birth: Jun 10, 1945 Referring Provider:  Eldridge Abrahams, MD  Encounter Date: 06/03/2014      PT End of Session - 06/03/14 1755    Visit Number 11   Number of Visits 17   Date for PT Re-Evaluation 06/12/14   Authorization Type Medicare-G code required   PT Start Time 0805   PT Stop Time 0847   PT Time Calculation (min) 42 min   Activity Tolerance Patient tolerated treatment well   Behavior During Therapy [facility] for tasks assessed/performed      Past Medical History  Diagnosis Date  . Diabetes mellitus   . Syncope   . Follicular lymphoma   . Anemia   . Sleep apnea   . Esophageal web     Past Surgical History  Procedure Laterality Date  . No past surgeries      There were no vitals filed for this visit.  Visit Diagnosis:  Impaired gait  Lack of coordination      Subjective Assessment - 06/03/14 0817    Subjective Pt reports 2 falls since last visit-one while using treadmill in a hotel fitness area and another in bathroom.  Reports he pushed wrong button on treadmill and it "took off".  He reports he slipped on towel in bathroom for other fall.  Pt reports he quit taking all his medications because he didn't think they were helping and his daughter (who is an MD) told him to quit taking them.  Reports he feel fatigued since stopping meds.  Was not taking Sinemet consistently per pt and wife.   Patient is accompained by: Family member  wife   Patient Stated Goals To improve ability to sit to stand, to reduce stiffness in the left leg,    Currently in Pain? Other (Comment)  bil knees sore from fall but denies true pain                         [facility] Adult PT Treatment/Exercise - 06/03/14 0001    Lumbar  Exercises: Aerobic   Stationary Bike Scifit level 2. all 4 extremities x 10 minutes for flexibility           [facility]) - 06/03/14 1746    PWR! exercises Moves in sitting;Moves in standing   PWR! Up 20   PWR! Rock 20   PWR! Twist 20   PWR Step 20   Basic 4 Flow cues for intensity and slowing movements   PWR! Up 20   PWR! Rock 20   PWR! Twist 20   PWR! Step 20   Comments cues for intensity and to slow movements down      Extensive discussion with patient and wife on importance of notifying MD before he discontinues medication and importance of taking medication on time and consistently.        PT Education - 06/03/14 1748    Education provided Yes   Education Details Importance of contacting MD about stopping medications and concerns over falls, need to take meds consistently, using phone timer as reminder to take medications, importance of exercise   Person(s) Educated Patient;Spouse   Methods Explanation   Comprehension Verbalized understanding          PT Short Term Goals - 05/19/14 1224  PT SHORT TERM GOAL #1   Title Pt will demonstrate correct performance of HEP with help of family member if needed. Target 05/15/14   Status Achieved   PT SHORT TERM GOAL #2   Title Pt will demonstrate ability to perform TUG cognitive at any pace, without loss of balance for improved dual tasking. Target 05/15/14   Status Achieved  16.53 seconds without loss of balance, with good attention to cognitive task.   PT SHORT TERM GOAL #3   Title PT will increase score on FGA to 16/30 Target 05/15/14   Status Achieved  21/30 on 05/12/14   PT SHORT TERM GOAL #4   Title Pt will perform 5x sit to stand without UE push off and without retropulsion and with equal weight bearing (no veering left) for improved safety with functional mobility.  Target 05/15/14   Status Partially Met  performs without UE push off without retropulsion but continues to veer left (increased L weight bearing and  unable to correct with verbal cues)           PT Long Term Goals - 04/16/14 1329    PT LONG TERM GOAL #1   Title Pt and wife will verbalize understanding of fall prevention strategies in home environment. Target 06/12/14   PT LONG TERM GOAL #2   Title Pt will demonstrate ability to perform 5x sit to stand in <11.5 seconds without UE support or retropulsion for decreased debiliyt. Target 06/12/14   PT LONG TERM GOAL #3   Title Pt will decrease TUG cognitive to <13.5 seconds for decreased fall risk. Target 06/12/14   PT LONG TERM GOAL #4   Title Pt will increase FGA score to 21/30. Target 06/12/14   PT LONG TERM GOAL #5   Title Pt will decrease TUG manual to <13.5 seconds for decreased fall risk. Target 06/12/14               Plan - 06/03/14 1755    Clinical Impression Statement Pt continues to be hesitant to follow recommendations of therapist or md concerning exercise and medication.  Wife reports that family sees a decline in his mobility.  Continue PT per POC.   Pt will benefit from skilled therapeutic intervention in order to improve on the following deficits Abnormal gait;Decreased coordination;Decreased range of motion;Difficulty walking;Impaired flexibility;Decreased balance;Decreased knowledge of use of DME;Decreased mobility;Decreased strength;Impaired perceived functional ability   Rehab Potential Good   Clinical Impairments Affecting Rehab Potential impaired awareness of deficits.    PT Frequency 2x / week   PT Duration 8 weeks   PT Treatment/Interventions ADLs/Self Care Home Management;Therapeutic activities;Patient/family education;Therapeutic exercise;DME Instruction;Gait training;Balance training;Manual techniques;Stair training;Neuromuscular re-education;Functional mobility training   PT Next Visit Plan Follow up to see if pt/family contacted Dr Linus Mako concerning meds.  Fall prevention education.   Consulted and Agree with Plan of Care Patient;Family member/caregiver    Family Member Consulted wife        Problem List Patient Active Problem List   Diagnosis Date Noted  . Syncope     [name] 06/03/2014, 6:00 PM  [facility] [address] [place], [place], [address] Phone: [phone]   Fax:  [place], [place] 06/03/2014 6:00 PM Phone: [phone] Fax: [phone]

## 2014-06-04 ENCOUNTER — Ambulatory Visit: Payer: Medicare Other | Admitting: Physical Therapy

## 2014-06-09 ENCOUNTER — Ambulatory Visit: Payer: Medicare Other | Admitting: Physical Therapy

## 2014-06-09 DIAGNOSIS — R269 Unspecified abnormalities of gait and mobility: Secondary | ICD-10-CM | POA: Diagnosis not present

## 2014-06-09 DIAGNOSIS — R1312 Dysphagia, oropharyngeal phase: Secondary | ICD-10-CM | POA: Diagnosis not present

## 2014-06-09 DIAGNOSIS — R279 Unspecified lack of coordination: Secondary | ICD-10-CM | POA: Diagnosis not present

## 2014-06-09 DIAGNOSIS — R490 Dysphonia: Secondary | ICD-10-CM | POA: Diagnosis not present

## 2014-06-09 DIAGNOSIS — R41841 Cognitive communication deficit: Secondary | ICD-10-CM | POA: Diagnosis not present

## 2014-06-09 NOTE — Patient Instructions (Signed)

## 2014-06-09 NOTE — Therapy (Signed)
[facility] [address] [place], [place], [address] Phone: [phone]   Fax:  [phone]  Physical Therapy Treatment  Patient Details  Name: Benjamin York MRN: [id number] Date of Birth: May 07, 1945 Referring Provider:  Eldridge Abrahams, MD  Encounter Date: 06/09/2014      PT End of Session - 06/09/14 1612    Visit Number 12   Number of Visits 17   Date for PT Re-Evaluation 06/12/14   Authorization Type Medicare-G code required   PT Start Time 6945   PT Stop Time 1400   PT Time Calculation (min) 38 min   Activity Tolerance Patient tolerated treatment well   Behavior During Therapy [facility] for tasks assessed/performed      Past Medical History  Diagnosis Date  . Diabetes mellitus   . Syncope   . Follicular lymphoma   . Anemia   . Sleep apnea   . Esophageal web     Past Surgical History  Procedure Laterality Date  . No past surgeries      There were no vitals filed for this visit.  Visit Diagnosis:  Impaired gait  Lack of coordination      Subjective Assessment - 06/09/14 1325    Subjective Pt has started back on medication since last visit; feels more alert since getting back on medication.   Patient is accompained by: Family member  wife   Currently in Pain? No/denies            [facility] PT Assessment - 06/09/14 1606    Transfers   Transfers Sit to Stand;Stand to Sit   Sit to Stand 5: Supervision  from chair x 10, from 16" compliant, then 14" solid seat   Sit to Stand Details (indicate cue type and reason) cues for increased momentum, increased forward lean and foot placement   Stand to Sit 5: Supervision   Stand to Sit Details 5x sit<>stand:  12.75 sec   Ambulation/Gait   Ambulation/Gait Yes   Ambulation/Gait Assistance 5: Supervision   Ambulation/Gait Assistance Details postural cues for increased step length, visual cues for posture   Ambulation Distance (Feet) 500 Feet  then 200  ft   Assistive device None   Ambulation Surface Level;Indoor   Gait velocity 9.25 sec=3.54 ft/sec   Timed Up and Go Test   TUG Normal TUG;Manual TUG;Cognitive TUG   Normal TUG (seconds) 13.15   Manual TUG (seconds) 13.28   Cognitive TUG (seconds) 15.94     Practiced floor>stand transfers, as pt reports having difficulty getting up from floor when he fell several weeks ago.  Pt performs floor>stand transfers x 5 reps with minimal UE support and cues for wide foot placement and upright posture upon standing.                Provided information to patient on fall prevention for home environment.  Also discussed progress towards goals, plan of care and plans for discharge next visit.          PT Education - 06/09/14 1333    Education provided Yes   Education Details Fall prevention within home environment   Person(s) Educated Patient;Spouse   Methods Explanation   Comprehension Verbalized understanding          PT Short Term Goals - 05/19/14 1224    PT SHORT TERM GOAL #1   Title Pt will demonstrate correct performance of HEP with help of family member if needed. Target 05/15/14   Status Achieved  PT SHORT TERM GOAL #2   Title Pt will demonstrate ability to perform TUG cognitive at any pace, without loss of balance for improved dual tasking. Target 05/15/14   Status Achieved  16.53 seconds without loss of balance, with good attention to cognitive task.   PT SHORT TERM GOAL #3   Title PT will increase score on FGA to 16/30 Target 05/15/14   Status Achieved  21/30 on 05/12/14   PT SHORT TERM GOAL #4   Title Pt will perform 5x sit to stand without UE push off and without retropulsion and with equal weight bearing (no veering left) for improved safety with functional mobility.  Target 05/15/14   Status Partially Met  performs without UE push off without retropulsion but continues to veer left (increased L weight bearing and unable to correct with verbal cues)            PT Long Term Goals - 06/09/14 1335    PT LONG TERM GOAL #1   Title Pt and wife will verbalize understanding of fall prevention strategies in home environment. Target 06/12/14   Status Achieved   PT LONG TERM GOAL #2   Title Pt will demonstrate ability to perform 5x sit to stand in <11.5 seconds without UE support or retropulsion for decreased debiliyt. Target 06/12/14   Baseline 12.75 sec with decreased forward lean 06/09/14   Status Not Met   PT LONG TERM GOAL #3   Title Pt will decrease TUG cognitive to <13.5 seconds for decreased fall risk. Target 06/12/14   Status On-going   PT LONG TERM GOAL #4   Title Pt will increase FGA score to 21/30. Target 06/12/14   Status On-going   PT LONG TERM GOAL #5   Title Pt will decrease TUG manual to <13.5 seconds for decreased fall risk. Target 06/12/14   Status Achieved               Plan - 06/09/14 1614    Clinical Impression Statement Pt has met LTG #1 and 5.  LTG #2 not met for sit<>stand transfers.  Pt requires cues ofr positioning and technique for improved transfers from lower surfaces.  LTG # 3 and 4 to be assessed next visit, with plans to discharge next visit.   Pt will benefit from skilled therapeutic intervention in order to improve on the following deficits Abnormal gait;Decreased coordination;Decreased range of motion;Difficulty walking;Impaired flexibility;Decreased balance;Decreased knowledge of use of DME;Decreased mobility;Decreased strength;Impaired perceived functional ability   Rehab Potential Good   PT Frequency 2x / week   PT Duration 8 weeks   PT Treatment/Interventions ADLs/Self Care Home Management;Therapeutic activities;Patient/family education;Therapeutic exercise;DME Instruction;Gait training;Balance training;Manual techniques;Stair training;Neuromuscular re-education;Functional mobility training   PT Next Visit Plan plan for discharge next visit   Consulted and Agree with Plan of Care Patient;Family member/caregiver    Family Member Consulted wife        Problem List Patient Active Problem List   Diagnosis Date Noted  . Syncope     Wonda Goodgame W. 06/09/2014, 4:17 PM Frazier Butt., PT [facility] [address] [place], [place], [address] Phone: [phone]   Fax:  [phone]

## 2014-06-11 ENCOUNTER — Other Ambulatory Visit (HOSPITAL_COMMUNITY): Payer: Self-pay | Admitting: Neurology

## 2014-06-11 ENCOUNTER — Ambulatory Visit: Payer: Medicare Other | Admitting: Speech Pathology

## 2014-06-11 ENCOUNTER — Ambulatory Visit: Payer: Medicare Other | Admitting: Physical Therapy

## 2014-06-11 DIAGNOSIS — R490 Dysphonia: Secondary | ICD-10-CM

## 2014-06-11 DIAGNOSIS — R41841 Cognitive communication deficit: Secondary | ICD-10-CM | POA: Diagnosis not present

## 2014-06-11 DIAGNOSIS — R279 Unspecified lack of coordination: Secondary | ICD-10-CM | POA: Diagnosis not present

## 2014-06-11 DIAGNOSIS — R1312 Dysphagia, oropharyngeal phase: Secondary | ICD-10-CM

## 2014-06-11 DIAGNOSIS — R269 Unspecified abnormalities of gait and mobility: Secondary | ICD-10-CM | POA: Diagnosis not present

## 2014-06-11 DIAGNOSIS — G20 Parkinson's disease: Secondary | ICD-10-CM

## 2014-06-11 DIAGNOSIS — R1314 Dysphagia, pharyngoesophageal phase: Secondary | ICD-10-CM

## 2014-06-11 NOTE — Therapy (Signed)
[facility] [address] [place], [place], [address] Phone: [phone]   Fax:  [phone]  Physical Therapy Treatment  Patient Details  Name: Benjamin York MRN: [id number] Date of Birth: [birth year] Referring Provider:  Eldridge Abrahams, MD  Encounter Date: 06/11/2014      PT End of Session - 06/11/14 1644    Visit Number 13   Number of Visits 17   Date for PT Re-Evaluation 06/12/14   Authorization Type Medicare-G code required   PT Start Time 0804   PT Stop Time 0846   PT Time Calculation (min) 42 min   Activity Tolerance Patient tolerated treatment well   Behavior During Therapy [facility] for tasks assessed/performed      Past Medical History  Diagnosis Date  . Diabetes mellitus   . Syncope   . Follicular lymphoma   . Anemia   . Sleep apnea   . Esophageal web     Past Surgical History  Procedure Laterality Date  . No past surgeries      There were no vitals filed for this visit.  Visit Diagnosis:  Impaired gait      Subjective Assessment - 06/11/14 0809    Subjective Denies falls or changes. Wife reports he is "lazy" and didnt do anything yesterday but was active on Tuesday outside.   Patient Stated Goals To improve ability to sit to stand, to reduce stiffness in the left leg,             [facility] PT Assessment - 06/11/14 1643    Functional Gait  Assessment   Gait assessed  Yes   Gait Level Surface Walks 20 ft in less than 7 sec but greater than 5.5 sec, uses assistive device, slower speed, mild gait deviations, or deviates 6-10 in outside of the 12 in walkway width.   Change in Gait Speed Able to smoothly change walking speed without loss of balance or gait deviation. Deviate no more than 6 in outside of the 12 in walkway width.   Gait with Horizontal Head Turns Performs head turns smoothly with no change in gait. Deviates no more than 6 in outside 12 in walkway width   Gait with Vertical Head  Turns Performs head turns with no change in gait. Deviates no more than 6 in outside 12 in walkway width.   Gait and Pivot Turn Pivot turns safely within 3 sec and stops quickly with no loss of balance.   Step Over Obstacle Is able to step over 2 stacked shoe boxes taped together (9 in total height) without changing gait speed. No evidence of imbalance.   Gait with Narrow Base of Support Ambulates 4-7 steps.   Gait with Eyes Closed Walks 20 ft, uses assistive device, slower speed, mild gait deviations, deviates 6-10 in outside 12 in walkway width. Ambulates 20 ft in less than 9 sec but greater than 7 sec.   Ambulating Backwards Walks 20 ft, uses assistive device, slower speed, mild gait deviations, deviates 6-10 in outside 12 in walkway width.   Steps Alternating feet, must use rail.   Total Score 24                     [facility] Adult PT Treatment/Exercise - 06/11/14 0001    Timed Up and Go Test   Cognitive TUG (seconds) 14.03                PT Education - 06/11/14 1639  Education provided Yes   Education Details [employer], Screen for PT/OT/ST in 5-6 months, importance of community fitness, importance of taking medication as prescribed by MD and on-time/consistently   Person(s) Educated Patient;Spouse;Child(ren)   Methods Explanation;Handout   Comprehension Verbalized understanding          PT Short Term Goals - 05/19/14 1224    PT SHORT TERM GOAL #1   Title Pt will demonstrate correct performance of HEP with help of family member if needed. Target 05/15/14   Status Achieved   PT SHORT TERM GOAL #2   Title Pt will demonstrate ability to perform TUG cognitive at any pace, without loss of balance for improved dual tasking. Target 05/15/14   Status Achieved  16.53 seconds without loss of balance, with good attention to cognitive task.   PT SHORT TERM GOAL #3   Title PT will increase score on FGA to 16/30 Target 05/15/14   Status Achieved  21/30 on  05/12/14   PT SHORT TERM GOAL #4   Title Pt will perform 5x sit to stand without UE push off and without retropulsion and with equal weight bearing (no veering left) for improved safety with functional mobility.  Target 05/15/14   Status Partially Met  performs without UE push off without retropulsion but continues to veer left (increased L weight bearing and unable to correct with verbal cues)           PT Long Term Goals - July 08, 2014 1641    PT LONG TERM GOAL #1   Title Pt and wife will verbalize understanding of fall prevention strategies in home environment. Target 06/12/14   Status Achieved   PT LONG TERM GOAL #2   Title Pt will demonstrate ability to perform 5x sit to stand in <11.5 seconds without UE support or retropulsion for decreased debiliyt. Target 06/12/14   Baseline 12.75 sec with decreased forward lean 06/09/14   Status Not Met   PT LONG TERM GOAL #3   Title Pt will decrease TUG cognitive to <13.5 seconds for decreased fall risk. Target 06/12/14   Status Not Met   PT LONG TERM GOAL #4   Title Pt will increase FGA score to 21/30. Target 06/12/14   Baseline 24/30 on 2014/07/08   Status Achieved   PT LONG TERM GOAL #5   Title Pt will decrease TUG manual to <13.5 seconds for decreased fall risk. Target 06/12/14   Status Achieved               Plan - 07-08-14 1644    Clinical Impression Statement Pt met LTG# 4.  LTG# 3 not met.  Continues to require encouragement by PT/PTA and family to perform exercises/walking/cardio.  Son present today and states that patient will not do the exercises at home and he feels it is a waste of time secondary to pt non-compliant.  Discharge pt per Mady Haagensen, PT and rescreen in 5-6 months.  Pt/family agree.   Clinical Impairments Affecting Rehab Potential impaired awareness of deficits.    PT Next Visit Plan Discharge   Consulted and Agree with Plan of Care Patient;Family member/caregiver   Family Member Consulted wife and son           G-Codes - 2014/07/08 1649    Functional Assessment Tool Used FGA 24/30;Gait velocity 3.54 ft/seconds no device      Problem List Patient Active Problem List   Diagnosis Date Noted  . Syncope     [name] 07/08/14,  [facility] [address] [place], [place], [address] Phone: [phone]   Fax:  [place], [place] 06/11/2014 4:53 PM Phone: [phone] Fax: [phone]

## 2014-06-11 NOTE — Therapy (Signed)
[facility] [address] [place], [place], [address] Phone: [phone]   Fax:  [phone]  Speech Language Pathology Evaluation  Patient Details  Name: Benjamin York MRN: [id number] Date of Birth: 18-Jan-1945 Referring Provider:  Eldridge Abrahams, MD  Encounter Date: 06/11/2014      End of Session - 06/11/14 0925    Visit Number 1   Number of Visits 16   Date for SLP Re-Evaluation 08/06/14   Authorization Type medicare - g code required   SLP Start Time 0845   SLP Stop Time  0926   SLP Time Calculation (min) 41 min   Activity Tolerance Patient tolerated treatment well      Past Medical History  Diagnosis Date  . Diabetes mellitus   . Syncope   . Follicular lymphoma   . Anemia   . Sleep apnea   . Esophageal web     Past Surgical History  Procedure Laterality Date  . No past surgeries       There were no vitals filed for this visit.  Visit Diagnosis: Oropharyngeal dysphagia  Hypokinetic Parkinsonian dysphonia - Plan: SLP eval and treat, MBSS scheduled on 06/17/14       Subjective Assessment - 06/11/14 0855    Subjective "Aytpical means I don't meet all the criteria of Parkinsonism"   Patient is accompained by: Family member   Currently in Pain? No/denies            SLP Evaluation [facility] - 06/11/14 6063    SLP Visit Information   SLP Received On 06/11/14   Onset Date dx with atypical PD several months ago   Medical Diagnosis atypical Parkinson's   General Information   Mobility Status walks independently   Prior Functional Status   Cognitive/Linguistic Baseline Baseline deficits   Baseline deficit details memory   Type of Home House    Lives With Spouse;Son   Available Support Family   Vocation Retired   Associate Professor   Overall Cognitive Status History of cognitive impairments - at baseline   Retail buyer   Overall Motor Speech Impaired   Respiration Within functional limits   Phonation Hoarse;Wet   Articulation Within functional limitis   Intelligibility Intelligibility reduced   Word 75-100% accurate   Phonation --              SLP Education - 06/11/14 0925    Education provided Yes   Education Details recommend MBSS, goals for ST, areas of impairment   Person(s) Educated Patient;Spouse   Methods Explanation   Comprehension Verbalized understanding          SLP Short Term Goals - 06/11/14 1021    SLP SHORT TERM GOAL #1   Title Pt will follow dysphagia HEP (pending MBSS results) with occassional min A   Time 4   Period Weeks   Status New   SLP SHORT TERM GOAL #2   Title Pt will utilize compensations for dysarthira during structured speech tasks 8/10 trials with occassional min A   Time 4   Period Weeks   SLP SHORT TERM GOAL #3   Title Pt will verbalize swallow strategies and diet modifications (pending MBSS results) with occassional min verbal and written cues.   Time 4   Period Weeks   Status New          SLP Long Term Goals - 06/11/14 1025    SLP LONG TERM GOAL #1   Title Pt will be 95% intelligible  and audible during 10 minute conversation in noisy environment with occassional min A   Time 8   Period Weeks   Status New   SLP LONG TERM GOAL #2   Title Pt will use compensations for dysarthira during 10 minute simple conversation with occassional min A   Time 8   Period Weeks   Status On-going   SLP LONG TERM GOAL #3   Title Pt will perform dysphagia HEP (pending results of MBSS) with 90% accuracy and rare min A   Time 8   Period Weeks   Status New   SLP LONG TERM GOAL #4   Title Pt and family will report carryover of diet modifications and swallow strategies outside of therapy over 3 sessions with rare min A (pending MBSS)   Time 8   Period Weeks   Status New          Plan - 06/23/2014 0926    Clinical Impression Statement Benjamin York is a 69 y.o.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 He recently completed outpt PT  to reduce falls. Benjamin York is accompanied by his spouse and son today. Family and pt report difficulty swallowing liquids, with frequent coughing and throat clearing. Mrs. Marinello reports that it is difficult to get her husband to drink enough during the day due to this swallowing difficulty. Pt denies difficulty with pills.  PO trails of thin liquid reveal overt s/s of aspirtion with multiple swallows, wet voice and thraot clears after each swallow. During this assessment, pt stated, "it is hard, I have trouble swallowing this." PO trials of soft solid appeared to be [facility]. They deny h/o pna or bronchitis. I recommend MBSS to objectvely assess pharyngeal swallow and determine postural/diet compensations to improve the safety and ease of swallowing. At the time of this eval, Benjamin York speech is intelligible, however his family report that during the day, his voice becomes quiet and his speech sounds "sleepy." A sound level meter placed 30cm from pt's mouth reveal Benjamin York conversational volume to be 75dB, which is [facility]. Family does report difficulty understanding pt at times due to low volume.  Benjamin York does present today with mild hoarseness.  Pt has h/o of memory problems and pt reports that he  "has trouble with recall."  His spouse reports that due to these cognitive impairments, he denies difficulty with his speech and intelligibility.  I do observe mildy rapid rate of speech and monotone speech with mildly flat affect. 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 I recommend skilled ST to maximize his intelligibilty and safety of swallow pending MBSS results.   Speech Therapy Frequency 2x / week   Duration Other (comment)  8 weeks   Treatment/Interventions Aspiration precaution training;Pharyngeal strengthening exercises;Diet toleration management by SLP;Compensatory techniques;Multimodal communcation approach;Patient/family education;SLP instruction and feedback;Internal/external aids    Potential to Achieve Goals Good   Potential Considerations Ability to learn/carryover information   Consulted and Agree with Plan of Care Patient;Family member/caregiver   Family Member Consulted spouse, son          G-Codes - 2014/06/23 1029    Functional Assessment Tool Used NOMS   Functional Limitations Motor speech   Motor Speech Current Status [phone]) At least 1 percent but less than 20 percent impaired, limited or restricted   Motor Speech Goal Status [phone]) At least 1 percent but less than 20 percent impaired, limited or restricted      Problem List Patient Active Problem List   Diagnosis Date Noted  .  Syncope     Lovvorn, Annye Rusk MS, CCC-SLP 06/11/2014, 10:36 AM  [facility] [address] [place], [place], [address] Phone: [phone]   Fax:  [phone]

## 2014-06-17 ENCOUNTER — Ambulatory Visit (HOSPITAL_COMMUNITY)
Admission: RE | Admit: 2014-06-17 | Discharge: 2014-06-17 | Disposition: A | Discharge status: Home / Self Care | Payer: Medicare Other | Source: Ambulatory Visit | Attending: Neurology | Admitting: Neurology

## 2014-06-17 DIAGNOSIS — R1314 Dysphagia, pharyngoesophageal phase: Secondary | ICD-10-CM

## 2014-06-17 DIAGNOSIS — R05 Cough: Secondary | ICD-10-CM | POA: Diagnosis not present

## 2014-06-17 NOTE — Procedures (Signed)
Objective Swallowing Evaluation: Other (Comment) (MBS)  Patient Details  Name: Benjamin York MRN: [id number] Date of Birth: Feb 06, 1945  Today's Date: 06/17/2014 Time: SLP Start Time (ACUTE ONLY): 1205-SLP Stop Time (ACUTE ONLY): 1235 SLP Time Calculation (min) (ACUTE ONLY): 30 min  Past Medical History:  Past Medical History  Diagnosis Date  . Diabetes mellitus   . Syncope   . Follicular lymphoma   . Anemia   . Sleep apnea   . Esophageal web    Past Surgical History:  Past Surgical History  Procedure Laterality Date  . 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  Pt reports recently being worked up for possible atypical Parkinson's- potentially over the last approximately one year.    Pt PMH + for intestinal lymphoma, DM, esophageal spasms, recent oral ulceration due to dental partials, DISH.   He reports some intentional weight loss and denies pulmonary infections/requring heimlich manuever.    Per pt self referral form, he indicates esophageal stretching x2 without improvement in symptoms.  Dr Michela Pitcher reports only "occasional" issues with coughing with liquids however spouse states he does not drink liquids due to dysphagia.  Pt also reports to swallow "small" pills with NO liquids.     Subjective:  Pt today states "As you can tell, my speech is fine."  Assessment / Plan / Recommendation CHL IP CLINICAL IMPRESSIONS 06/17/2014  Therapy Diagnosis Mild oral phase dysphagia;Mild pharyngeal phase dysphagia;Suspected primary esophageal dysphagia  Clinical Impression  Pt presents with mild oropharyngeal dysphagia with sensorimotor deficits.  Decreased lingual strength results in piecemealing across all consistencies.  His piecemealing is effective however to maximize oropharyngeal clearance.     Pharyngeal swallow was overall strong without only minimal vallecular residuals  of solids/pudding more than liquid.  Of note, barium tablet *uncertain if swallowed dry or spilled to vallecular space* precariously lodged at vallecular space WITHOUT pt awareness/sensation.  Cued consumption of liquids facilitated clearance into esophagus.   No aspiration or penetration observed even when pt taxed to "drink sequentially" via straw- which he did on limited basis.     No coughing/throat clearing noted during MBS which makes it difficult to determine source of "coughing" with intake.  SLP questions if it is due to esophageal source.    Upon esophageal sweep, barium tablet appeared to lodged in distal region WITHOUT pt sensation with appearance of esophageal spasm. Barium tablet did not clear with more liquid but easily transited into esophagus with pudding swallow.   Pt reports being informed that he has esophageal spasms per esophagram conducted approximately one year ago at [facility]  Using live video, educated pt/spouse to findings/recommendations.    Limited ROM with neck due to pt's previously diagnosed DISH prevented postural modifications for dysphagia.  Postural changes were also not indicated at this time but DISH may be limiting factor in the future.     Of note, spouse reports concerns that pt at times is resistant to her recommendations, advised her to speak to pt/MD re: this issue.  Thanks for this referral.        CHL IP TREATMENT RECOMMENDATION 06/17/2014  Treatment Recommendations F/u OP SLP     CHL IP DIET RECOMMENDATION 06/17/2014  SLP Diet Recommendations Age appropriate regular solids;Thin  Liquid Administration via Cup, straw  Medication Administration Whole meds with liquid  Compensations Small sips/bites;Multiple dry swallows after each  bite/sip  Postural Changes and/or Swallow Maneuvers N/a     CHL IP OTHER RECOMMENDATIONS 06/17/2014  Recommended Consults (None)  Oral Care Recommendations Oral care BID  Other Recommendations (None)         CHL IP  REASON FOR REFERRAL 06/17/2014  Reason for Referral Objectively evaluate swallowing function     CHL IP ORAL PHASE 06/17/2014  Oral Phase Impaired      CHL IP PHARYNGEAL PHASE 06/17/2014  Pharyngeal Phase Impaired            CHL IP CERVICAL ESOPHAGEAL PHASE 06/17/2014  Cervical Esophageal Phase [facility]  Cervical Esophageal Comment Of note, barium tablet *uncertain if swallowed dry or spilled to vallecular space* precariously lodged at vallecular space WITHOUT pt awareness/sensation.  Cued consumption of liquids facilitated clearance into esophagus.  No aspiration or penetration observed even when pt taxed to "drink sequentially" via straw- which he did not conduct.   No coughing/throat clearing noted during MBS either making it difficult to determine source of "coughing" with intake but qeustion if esophageal source.  Upon esophageal sweep, barium tablet appeared to lodged in distal region WITHOUT pt sensation with appearance of esophageal spasm. Barium tablet did not clear with more liquid but easily transited into esophagus with pudding swallow.  Pt reports being informed that he has esophageal spasms per esophagram conducted approximately one year ago at [facility].     CHL IP GO 06/17/2014  Functional Assessment Tool Used MBS, clinical judgement  Functional Limitations Swallowing  Swallow Current Status (L7989) CI  Swallow Goal Status (Q1194) CI  Swallow Discharge Status [phone]) [place], [facility] SLP [phone]

## 2014-06-18 ENCOUNTER — Encounter: Payer: Medicare Other | Admitting: Speech Pathology

## 2014-06-18 ENCOUNTER — Ambulatory Visit: Payer: Medicare Other | Admitting: Speech Pathology

## 2014-06-18 DIAGNOSIS — C8590 Non-Hodgkin lymphoma, unspecified, unspecified site: Secondary | ICD-10-CM | POA: Diagnosis not present

## 2014-06-18 DIAGNOSIS — H905 Unspecified sensorineural hearing loss: Secondary | ICD-10-CM | POA: Diagnosis not present

## 2014-06-18 DIAGNOSIS — H903 Sensorineural hearing loss, bilateral: Secondary | ICD-10-CM | POA: Diagnosis not present

## 2014-06-18 DIAGNOSIS — G4733 Obstructive sleep apnea (adult) (pediatric): Secondary | ICD-10-CM | POA: Diagnosis not present

## 2014-06-18 DIAGNOSIS — Z09 Encounter for follow-up examination after completed treatment for conditions other than malignant neoplasm: Secondary | ICD-10-CM | POA: Diagnosis not present

## 2014-06-18 DIAGNOSIS — E119 Type 2 diabetes mellitus without complications: Secondary | ICD-10-CM | POA: Diagnosis not present

## 2014-06-18 DIAGNOSIS — Z9621 Cochlear implant status: Secondary | ICD-10-CM | POA: Diagnosis not present

## 2014-06-18 DIAGNOSIS — H6121 Impacted cerumen, right ear: Secondary | ICD-10-CM | POA: Diagnosis not present

## 2014-06-18 DIAGNOSIS — G2581 Restless legs syndrome: Secondary | ICD-10-CM | POA: Diagnosis not present

## 2014-06-19 DIAGNOSIS — F5101 Primary insomnia: Secondary | ICD-10-CM | POA: Diagnosis not present

## 2014-06-19 DIAGNOSIS — I10 Essential (primary) hypertension: Secondary | ICD-10-CM | POA: Diagnosis not present

## 2014-06-19 DIAGNOSIS — E119 Type 2 diabetes mellitus without complications: Secondary | ICD-10-CM | POA: Diagnosis not present

## 2014-06-23 ENCOUNTER — Ambulatory Visit: Payer: Medicare Other | Admitting: Speech Pathology

## 2014-06-23 ENCOUNTER — Telehealth: Payer: Self-pay | Admitting: Speech Pathology

## 2014-06-23 NOTE — Telephone Encounter (Signed)
Phoned pt at contact number [phone]. Left message re: missed appointment today at 1:15. Requested pt or family call front desk to confirm his next appointment on Thursday 06/25/14 at 1:15.

## 2014-06-25 ENCOUNTER — Ambulatory Visit: Payer: Medicare Other | Admitting: Speech Pathology

## 2014-06-25 DIAGNOSIS — R490 Dysphonia: Secondary | ICD-10-CM

## 2014-06-25 DIAGNOSIS — G20 Parkinson's disease: Secondary | ICD-10-CM

## 2014-06-25 DIAGNOSIS — R269 Unspecified abnormalities of gait and mobility: Secondary | ICD-10-CM | POA: Diagnosis not present

## 2014-06-25 DIAGNOSIS — R279 Unspecified lack of coordination: Secondary | ICD-10-CM | POA: Diagnosis not present

## 2014-06-25 DIAGNOSIS — R1312 Dysphagia, oropharyngeal phase: Secondary | ICD-10-CM | POA: Diagnosis not present

## 2014-06-25 DIAGNOSIS — R41841 Cognitive communication deficit: Secondary | ICD-10-CM | POA: Diagnosis not present

## 2014-06-25 NOTE — Therapy (Signed)
[facility] [address] [place], [place], [address] Phone: [phone]   Fax:  [phone]  Speech Language Pathology Treatment  Patient Details  Name: Benjamin York MRN: [id number] Date of Birth: 1945-05-29 Referring Provider:  Eldridge Abrahams, MD  Encounter Date: 06/25/2014      End of Session - 06/25/14 1432    Visit Number 2   Number of Visits 16   Date for SLP Re-Evaluation 08/06/14   Authorization Type medicare - g code required   SLP Start Time 1318   SLP Stop Time  1400   SLP Time Calculation (min) 42 min   Activity Tolerance Patient tolerated treatment well      Past Medical History  Diagnosis Date  . Diabetes mellitus   . Syncope   . Follicular lymphoma   . Anemia   . Sleep apnea   . Esophageal web     Past Surgical History  Procedure Laterality Date  . No past surgeries      There were no vitals filed for this visit.  Visit Diagnosis: Cognitive communication deficit  Hypokinetic Parkinsonian dysphonia      Subjective Assessment - 06/25/14 1324    Subjective "Sometimes when he can't sleep he takes something to help him sleep" pt did not know name   Currently in Pain? No/denies               ADULT SLP TREATMENT - 06/25/14 1325    General Information   Behavior/Cognition Alert;Cooperative;Pleasant mood   Treatment Provided   Treatment provided Cognitive-Linquistic   Cognitive-Linquistic Treatment   Treatment focused on Dysarthria   Skilled Treatment Reviewed resuslts of MBSS - pt and spouse aware of results and recommendations of regular diet and thin liquids. She reports that since MBSS pt is drinking more fluids. Initated HEP for dysarthira to facilitate over articulation. Pt utilized loud volume and over articulation during strucured speech tasks with rare min cues. Spouse reports today minimal to no difficulty understanding pt at home. This is in contrast to what  familly reported during the ST eval.  Will continue to monitor dysarthria and implement HEP at this time. Spouse does express conncern re: pt's memory as he has misplaced the family checkbook and is misplacing mail and keys. I initiated memory compensations of lableing boxes/files for bills, junk mail and other mail to cue pt where to place mail. Faciliated pt and spouse generation of compensations of using labled box for keys/wallet and to lable the drawer where the checkbook goes.    Assessment / Recommendations / Plan   Plan Continue with current plan of care   Dysphagia Recommendations   Diet recommendations Regular;Thin liquid   Compensations Slow rate   Progression Toward Goals   Progression toward goals Progressing toward goals          SLP Education - 06/25/14 1423    Education provided Yes   Education Details Results of MBSS, compensations for memory and dysarthria HEP   Person(s) Educated Patient;Spouse   Methods Explanation;Demonstration;Handout   Comprehension Verbalized understanding;Need further instruction          SLP Short Term Goals - 06/25/14 1428    SLP SHORT TERM GOAL #1   Title Pt will follow dysphagia HEP (pending MBSS results with occassional min A   Time 4   Period Weeks   Status Deferred  due to results of MBSS   SLP [place] #2   Title Pt will utilize  compensations for dysarthira during structured speech tasks 8/10 trials with occassional min A   Time 4   Period Weeks   Status Achieved   SLP SHORT TERM GOAL #3   Title Pt will verbalize swallow strategies and diet modifications (pending MBSS results) with occassional min verbal and written cues.   Time 4   Period Weeks   Status Achieved   SLP SHORT TERM GOAL #4   Title Pt will utlize external aids for recall of names of meds,  names friends/colleagues, 1x a session over 2 sessions and management of mail and finances outside of therapy with usual  min A   Time 3   Period Weeks   Status New           SLP Long Term Goals - 06/25/14 1431    SLP LONG TERM GOAL #1   Title Pt will be 95% intelligible and audible during 10 minute conversation in noisy environment with occassional min A   Time 7   Period Weeks   Status New   SLP LONG TERM GOAL #2   Title Pt will use compensations for dysarthira during 10 minute simple conversation with occassional min A   Time 7   Period Weeks   Status On-going   SLP LONG TERM GOAL #3   Title Pt will perform dysphagia HEP (pending results of MBSS) with 90% accuracy and rare min A   Time 8   Period Weeks   Status Deferred  due to results of MBSS   SLP LONG TERM GOAL #4   Title Pt and family will report carryover of diet modifications and swallow strategies outside of therapy over 3 sessions with rare min A (pending MBSS)   Time 7   Period Weeks   Status On-going   SLP LONG TERM GOAL #5   Title Pt and family will report carryover of external aids for daily management of mail, finances, wallet/phone over 2 sessiosn with occassional min A   Time 7   Period Weeks   Status New          Plan - 06/25/14 1424    Clinical Impression Statement Pt/family verbalized results of MBSS with rare min A. Pt utilized compensations for dysarthria with rare min A during structured speech task and on  HEP. Pt and spouse to f/u with environmental compensations for reduced memory/recall. Due to results of MBSS and progress with dysarthtria compensations, I recommend skilled ST be reuced to 1x a week for 3 more weeks to maximize carryover of compensations for dysarthtria and memory outside of therapy.   Speech Therapy Frequency 1x /week   Treatment/Interventions Cognitive reorganization;Functional tasks;Patient/family education;Compensatory strategies;Internal/external aids;SLP instruction and feedback   Potential to Achieve Goals Good   Potential Considerations Ability to learn/carryover information   Consulted and Agree with Plan of Care Patient;Family  member/caregiver   Family Member Consulted spouse        Problem List Patient Active Problem List   Diagnosis Date Noted  . Syncope     Lovvorn, Annye Rusk MS, CCC-SLP 06/25/2014, 2:33 PM  [facility] [address] [place], [place], [address] Phone: [phone]   Fax:  [phone]

## 2014-06-25 NOTE — Patient Instructions (Signed)
  Label boxes for mail - bills, junk,  misc   Label a spot/box for keys/phone/wallet   Label drawer or area for checkbook   Cognitive Activities you can do at home:   - [place]  - Chess/Checkers  - Crosswords (easy level)  - [place]  On your computer, tablet or phone: Insurance [email or web address] Merchandiser, retail Hour Chocolate Fix Sort it out Environmental consultant App Photo Quiz App MixTwo App What's the Word?App

## 2014-06-30 ENCOUNTER — Ambulatory Visit: Payer: Medicare Other | Admitting: Speech Pathology

## 2014-07-02 ENCOUNTER — Ambulatory Visit: Payer: Medicare Other | Admitting: Speech Pathology

## 2014-07-07 ENCOUNTER — Encounter: Payer: Medicare Other | Admitting: Speech Pathology

## 2014-07-09 ENCOUNTER — Encounter: Payer: Medicare Other | Admitting: Speech Pathology

## 2014-07-14 ENCOUNTER — Encounter: Payer: Medicare Other | Admitting: Speech Pathology

## 2014-07-16 ENCOUNTER — Encounter: Payer: Medicare Other | Admitting: Speech Pathology

## 2014-07-20 DIAGNOSIS — R269 Unspecified abnormalities of gait and mobility: Secondary | ICD-10-CM | POA: Diagnosis not present

## 2014-07-20 DIAGNOSIS — M6281 Muscle weakness (generalized): Secondary | ICD-10-CM | POA: Diagnosis not present

## 2014-07-21 ENCOUNTER — Encounter: Payer: Medicare Other | Admitting: Speech Pathology

## 2014-07-23 ENCOUNTER — Encounter: Payer: Medicare Other | Admitting: Speech Pathology

## 2014-07-23 DIAGNOSIS — R269 Unspecified abnormalities of gait and mobility: Secondary | ICD-10-CM | POA: Diagnosis not present

## 2014-07-23 DIAGNOSIS — M6281 Muscle weakness (generalized): Secondary | ICD-10-CM | POA: Diagnosis not present

## 2014-07-28 ENCOUNTER — Encounter: Payer: Medicare Other | Admitting: Speech Pathology

## 2014-07-28 DIAGNOSIS — R6884 Jaw pain: Secondary | ICD-10-CM | POA: Diagnosis not present

## 2014-07-29 DIAGNOSIS — R269 Unspecified abnormalities of gait and mobility: Secondary | ICD-10-CM | POA: Diagnosis not present

## 2014-07-29 DIAGNOSIS — M6281 Muscle weakness (generalized): Secondary | ICD-10-CM | POA: Diagnosis not present

## 2014-07-30 DIAGNOSIS — M6281 Muscle weakness (generalized): Secondary | ICD-10-CM | POA: Diagnosis not present

## 2014-07-30 DIAGNOSIS — R269 Unspecified abnormalities of gait and mobility: Secondary | ICD-10-CM | POA: Diagnosis not present

## 2014-08-03 DIAGNOSIS — M6281 Muscle weakness (generalized): Secondary | ICD-10-CM | POA: Diagnosis not present

## 2014-08-03 DIAGNOSIS — R269 Unspecified abnormalities of gait and mobility: Secondary | ICD-10-CM | POA: Diagnosis not present

## 2014-08-04 ENCOUNTER — Encounter: Payer: Medicare Other | Admitting: Speech Pathology

## 2014-08-06 ENCOUNTER — Encounter: Payer: Medicare Other | Admitting: Speech Pathology

## 2014-08-10 DIAGNOSIS — M6281 Muscle weakness (generalized): Secondary | ICD-10-CM | POA: Diagnosis not present

## 2014-08-10 DIAGNOSIS — R269 Unspecified abnormalities of gait and mobility: Secondary | ICD-10-CM | POA: Diagnosis not present

## 2014-08-11 ENCOUNTER — Encounter: Payer: Medicare Other | Admitting: Speech Pathology

## 2014-08-13 ENCOUNTER — Encounter: Payer: Medicare Other | Admitting: Speech Pathology

## 2014-08-13 DIAGNOSIS — R269 Unspecified abnormalities of gait and mobility: Secondary | ICD-10-CM | POA: Diagnosis not present

## 2014-08-13 DIAGNOSIS — M6281 Muscle weakness (generalized): Secondary | ICD-10-CM | POA: Diagnosis not present

## 2014-09-23 DIAGNOSIS — Z23 Encounter for immunization: Secondary | ICD-10-CM | POA: Diagnosis not present

## 2014-09-25 DIAGNOSIS — D509 Iron deficiency anemia, unspecified: Secondary | ICD-10-CM | POA: Diagnosis not present

## 2014-09-25 DIAGNOSIS — C8213 Follicular lymphoma grade II, intra-abdominal lymph nodes: Secondary | ICD-10-CM | POA: Diagnosis not present

## 2014-10-01 DIAGNOSIS — Z79899 Other long term (current) drug therapy: Secondary | ICD-10-CM | POA: Diagnosis not present

## 2014-10-01 DIAGNOSIS — E119 Type 2 diabetes mellitus without complications: Secondary | ICD-10-CM | POA: Diagnosis not present

## 2014-10-01 DIAGNOSIS — C8590 Non-Hodgkin lymphoma, unspecified, unspecified site: Secondary | ICD-10-CM | POA: Diagnosis not present

## 2014-10-01 DIAGNOSIS — H903 Sensorineural hearing loss, bilateral: Secondary | ICD-10-CM | POA: Diagnosis not present

## 2014-10-01 DIAGNOSIS — G2581 Restless legs syndrome: Secondary | ICD-10-CM | POA: Diagnosis not present

## 2014-10-01 DIAGNOSIS — G4733 Obstructive sleep apnea (adult) (pediatric): Secondary | ICD-10-CM | POA: Diagnosis not present

## 2014-10-01 DIAGNOSIS — H905 Unspecified sensorineural hearing loss: Secondary | ICD-10-CM | POA: Diagnosis not present

## 2014-10-01 DIAGNOSIS — H6121 Impacted cerumen, right ear: Secondary | ICD-10-CM | POA: Diagnosis not present

## 2014-10-15 DIAGNOSIS — E119 Type 2 diabetes mellitus without complications: Secondary | ICD-10-CM | POA: Diagnosis not present

## 2014-10-15 DIAGNOSIS — I10 Essential (primary) hypertension: Secondary | ICD-10-CM | POA: Diagnosis not present

## 2014-10-15 DIAGNOSIS — K121 Other forms of stomatitis: Secondary | ICD-10-CM | POA: Diagnosis not present

## 2014-10-15 DIAGNOSIS — G231 Progressive supranuclear ophthalmoplegia [Steele-Richardson-Olszewski]: Secondary | ICD-10-CM | POA: Diagnosis not present

## 2014-11-19 ENCOUNTER — Encounter: Payer: Self-pay | Admitting: Speech Pathology

## 2014-11-19 DIAGNOSIS — H9193 Unspecified hearing loss, bilateral: Secondary | ICD-10-CM | POA: Diagnosis not present

## 2014-11-19 DIAGNOSIS — H9041 Sensorineural hearing loss, unilateral, right ear, with unrestricted hearing on the contralateral side: Secondary | ICD-10-CM | POA: Diagnosis not present

## 2014-11-19 NOTE — Therapy (Signed)
[facility] [address] [place], [place], [address] Phone: [phone]   Fax:  [phone]  Patient Details  Name: Benjamin York MRN: [id number] Date of Birth: 1945-11-16 Referring Provider:  Dr. Guadelupe Sabin  SPEECH THERAPY DISCHARGE SUMMARY  Visits from Start of Care: 2  Current functional level related to goals / functional outcomes: Speech therapy goals not met due to pt not returning to speech therapy after 2 visits   Remaining deficits: Cognition (memory) and dysarthira   Education / Equipment: Compensations for memory and dysarthria Plan: Patient agrees to discharge.  Patient goals were not met. Patient is being discharged due to not returning since the last visit.  ?????       Encounter Date: 11/19/2014   Iline Oven MS, CCC-SLP 11/19/2014, 11:23 AM  [facility] [address] [place], [place], [address] Phone: [phone]   Fax:  [phone]

## 2014-11-23 DIAGNOSIS — K121 Other forms of stomatitis: Secondary | ICD-10-CM | POA: Diagnosis not present

## 2015-03-25 DIAGNOSIS — H905 Unspecified sensorineural hearing loss: Secondary | ICD-10-CM | POA: Diagnosis not present

## 2015-04-18 DIAGNOSIS — S1191XA Laceration without foreign body of unspecified part of neck, initial encounter: Secondary | ICD-10-CM | POA: Diagnosis not present

## 2015-04-18 DIAGNOSIS — S0990XA Unspecified injury of head, initial encounter: Secondary | ICD-10-CM | POA: Diagnosis not present

## 2015-04-18 DIAGNOSIS — G319 Degenerative disease of nervous system, unspecified: Secondary | ICD-10-CM | POA: Diagnosis not present

## 2015-04-18 DIAGNOSIS — S0101XA Laceration without foreign body of scalp, initial encounter: Secondary | ICD-10-CM | POA: Diagnosis not present

## 2015-07-20 DIAGNOSIS — R5383 Other fatigue: Secondary | ICD-10-CM | POA: Diagnosis not present

## 2015-07-20 DIAGNOSIS — K121 Other forms of stomatitis: Secondary | ICD-10-CM | POA: Diagnosis not present

## 2015-07-20 DIAGNOSIS — M791 Myalgia: Secondary | ICD-10-CM | POA: Diagnosis not present

## 2015-07-20 DIAGNOSIS — G4733 Obstructive sleep apnea (adult) (pediatric): Secondary | ICD-10-CM | POA: Diagnosis not present

## 2015-07-20 DIAGNOSIS — E1165 Type 2 diabetes mellitus with hyperglycemia: Secondary | ICD-10-CM | POA: Diagnosis not present

## 2015-07-20 DIAGNOSIS — E1065 Type 1 diabetes mellitus with hyperglycemia: Secondary | ICD-10-CM | POA: Diagnosis not present

## 2015-07-22 DIAGNOSIS — H903 Sensorineural hearing loss, bilateral: Secondary | ICD-10-CM | POA: Diagnosis not present

## 2015-09-17 DIAGNOSIS — H903 Sensorineural hearing loss, bilateral: Secondary | ICD-10-CM | POA: Diagnosis not present

## 2015-11-18 DIAGNOSIS — S0101XA Laceration without foreign body of scalp, initial encounter: Secondary | ICD-10-CM | POA: Diagnosis not present

## 2015-11-18 DIAGNOSIS — Z79899 Other long term (current) drug therapy: Secondary | ICD-10-CM | POA: Diagnosis not present

## 2015-11-18 DIAGNOSIS — E119 Type 2 diabetes mellitus without complications: Secondary | ICD-10-CM | POA: Diagnosis not present

## 2015-11-18 DIAGNOSIS — Z23 Encounter for immunization: Secondary | ICD-10-CM | POA: Diagnosis not present

## 2015-11-18 DIAGNOSIS — W01198A Fall on same level from slipping, tripping and stumbling with subsequent striking against other object, initial encounter: Secondary | ICD-10-CM | POA: Diagnosis not present

## 2015-11-18 DIAGNOSIS — Z7984 Long term (current) use of oral hypoglycemic drugs: Secondary | ICD-10-CM | POA: Diagnosis not present

## 2015-11-30 DIAGNOSIS — J84112 Idiopathic pulmonary fibrosis: Secondary | ICD-10-CM | POA: Diagnosis not present

## 2015-12-02 DIAGNOSIS — J84112 Idiopathic pulmonary fibrosis: Secondary | ICD-10-CM | POA: Diagnosis not present

## 2015-12-02 DIAGNOSIS — R1310 Dysphagia, unspecified: Secondary | ICD-10-CM | POA: Diagnosis not present

## 2015-12-02 DIAGNOSIS — R05 Cough: Secondary | ICD-10-CM | POA: Diagnosis not present

## 2015-12-02 DIAGNOSIS — I700 Atherosclerosis of aorta: Secondary | ICD-10-CM | POA: Diagnosis not present

## 2015-12-02 DIAGNOSIS — I2510 Atherosclerotic heart disease of native coronary artery without angina pectoris: Secondary | ICD-10-CM | POA: Diagnosis not present

## 2015-12-08 DIAGNOSIS — Z7984 Long term (current) use of oral hypoglycemic drugs: Secondary | ICD-10-CM | POA: Diagnosis not present

## 2015-12-08 DIAGNOSIS — Z79899 Other long term (current) drug therapy: Secondary | ICD-10-CM | POA: Diagnosis not present

## 2015-12-08 DIAGNOSIS — Z87442 Personal history of urinary calculi: Secondary | ICD-10-CM | POA: Diagnosis not present

## 2015-12-08 DIAGNOSIS — N132 Hydronephrosis with renal and ureteral calculous obstruction: Secondary | ICD-10-CM | POA: Diagnosis not present

## 2015-12-08 DIAGNOSIS — R1031 Right lower quadrant pain: Secondary | ICD-10-CM | POA: Diagnosis not present

## 2015-12-08 DIAGNOSIS — E119 Type 2 diabetes mellitus without complications: Secondary | ICD-10-CM | POA: Diagnosis not present

## 2015-12-14 DIAGNOSIS — R262 Difficulty in walking, not elsewhere classified: Secondary | ICD-10-CM | POA: Diagnosis not present

## 2015-12-14 DIAGNOSIS — M6281 Muscle weakness (generalized): Secondary | ICD-10-CM | POA: Diagnosis not present

## 2015-12-16 DIAGNOSIS — M6281 Muscle weakness (generalized): Secondary | ICD-10-CM | POA: Diagnosis not present

## 2015-12-16 DIAGNOSIS — R262 Difficulty in walking, not elsewhere classified: Secondary | ICD-10-CM | POA: Diagnosis not present

## 2015-12-23 DIAGNOSIS — R262 Difficulty in walking, not elsewhere classified: Secondary | ICD-10-CM | POA: Diagnosis not present

## 2015-12-23 DIAGNOSIS — M6281 Muscle weakness (generalized): Secondary | ICD-10-CM | POA: Diagnosis not present

## 2015-12-29 DIAGNOSIS — R262 Difficulty in walking, not elsewhere classified: Secondary | ICD-10-CM | POA: Diagnosis not present

## 2015-12-29 DIAGNOSIS — M6281 Muscle weakness (generalized): Secondary | ICD-10-CM | POA: Diagnosis not present

## 2015-12-31 DIAGNOSIS — R262 Difficulty in walking, not elsewhere classified: Secondary | ICD-10-CM | POA: Diagnosis not present

## 2015-12-31 DIAGNOSIS — M6281 Muscle weakness (generalized): Secondary | ICD-10-CM | POA: Diagnosis not present

## 2016-01-06 DIAGNOSIS — M6281 Muscle weakness (generalized): Secondary | ICD-10-CM | POA: Diagnosis not present

## 2016-01-06 DIAGNOSIS — R262 Difficulty in walking, not elsewhere classified: Secondary | ICD-10-CM | POA: Diagnosis not present

## 2016-01-07 DIAGNOSIS — R262 Difficulty in walking, not elsewhere classified: Secondary | ICD-10-CM | POA: Diagnosis not present

## 2016-01-07 DIAGNOSIS — M6281 Muscle weakness (generalized): Secondary | ICD-10-CM | POA: Diagnosis not present

## 2016-01-09 DIAGNOSIS — W01198A Fall on same level from slipping, tripping and stumbling with subsequent striking against other object, initial encounter: Secondary | ICD-10-CM | POA: Diagnosis not present

## 2016-01-09 DIAGNOSIS — E119 Type 2 diabetes mellitus without complications: Secondary | ICD-10-CM | POA: Diagnosis not present

## 2016-01-09 DIAGNOSIS — R51 Headache: Secondary | ICD-10-CM | POA: Diagnosis not present

## 2016-01-09 DIAGNOSIS — Z79899 Other long term (current) drug therapy: Secondary | ICD-10-CM | POA: Diagnosis not present

## 2016-01-09 DIAGNOSIS — S0101XA Laceration without foreign body of scalp, initial encounter: Secondary | ICD-10-CM | POA: Diagnosis not present

## 2016-01-09 DIAGNOSIS — Z7984 Long term (current) use of oral hypoglycemic drugs: Secondary | ICD-10-CM | POA: Diagnosis not present

## 2016-01-11 DIAGNOSIS — H903 Sensorineural hearing loss, bilateral: Secondary | ICD-10-CM | POA: Diagnosis not present

## 2016-01-11 DIAGNOSIS — R2689 Other abnormalities of gait and mobility: Secondary | ICD-10-CM | POA: Diagnosis not present

## 2016-01-13 DIAGNOSIS — E291 Testicular hypofunction: Secondary | ICD-10-CM | POA: Diagnosis not present

## 2016-01-13 DIAGNOSIS — M6281 Muscle weakness (generalized): Secondary | ICD-10-CM | POA: Diagnosis not present

## 2016-01-13 DIAGNOSIS — R262 Difficulty in walking, not elsewhere classified: Secondary | ICD-10-CM | POA: Diagnosis not present

## 2016-01-13 DIAGNOSIS — T65891A Toxic effect of other specified substances, accidental (unintentional), initial encounter: Secondary | ICD-10-CM | POA: Diagnosis not present

## 2016-01-13 DIAGNOSIS — E039 Hypothyroidism, unspecified: Secondary | ICD-10-CM | POA: Diagnosis not present

## 2016-01-13 DIAGNOSIS — Z125 Encounter for screening for malignant neoplasm of prostate: Secondary | ICD-10-CM | POA: Diagnosis not present

## 2016-01-13 DIAGNOSIS — E119 Type 2 diabetes mellitus without complications: Secondary | ICD-10-CM | POA: Diagnosis not present

## 2016-01-13 DIAGNOSIS — N400 Enlarged prostate without lower urinary tract symptoms: Secondary | ICD-10-CM | POA: Diagnosis not present

## 2016-01-18 DIAGNOSIS — M6281 Muscle weakness (generalized): Secondary | ICD-10-CM | POA: Diagnosis not present

## 2016-01-18 DIAGNOSIS — R262 Difficulty in walking, not elsewhere classified: Secondary | ICD-10-CM | POA: Diagnosis not present

## 2016-01-25 DIAGNOSIS — R262 Difficulty in walking, not elsewhere classified: Secondary | ICD-10-CM | POA: Diagnosis not present

## 2016-01-25 DIAGNOSIS — M6281 Muscle weakness (generalized): Secondary | ICD-10-CM | POA: Diagnosis not present

## 2016-01-27 DIAGNOSIS — R262 Difficulty in walking, not elsewhere classified: Secondary | ICD-10-CM | POA: Diagnosis not present

## 2016-01-27 DIAGNOSIS — R972 Elevated prostate specific antigen [PSA]: Secondary | ICD-10-CM | POA: Diagnosis not present

## 2016-01-27 DIAGNOSIS — M6281 Muscle weakness (generalized): Secondary | ICD-10-CM | POA: Diagnosis not present

## 2016-02-01 DIAGNOSIS — M6281 Muscle weakness (generalized): Secondary | ICD-10-CM | POA: Diagnosis not present

## 2016-02-01 DIAGNOSIS — R262 Difficulty in walking, not elsewhere classified: Secondary | ICD-10-CM | POA: Diagnosis not present

## 2016-02-03 DIAGNOSIS — M6281 Muscle weakness (generalized): Secondary | ICD-10-CM | POA: Diagnosis not present

## 2016-02-03 DIAGNOSIS — R262 Difficulty in walking, not elsewhere classified: Secondary | ICD-10-CM | POA: Diagnosis not present

## 2016-02-08 DIAGNOSIS — M6281 Muscle weakness (generalized): Secondary | ICD-10-CM | POA: Diagnosis not present

## 2016-02-08 DIAGNOSIS — R262 Difficulty in walking, not elsewhere classified: Secondary | ICD-10-CM | POA: Diagnosis not present

## 2016-02-10 DIAGNOSIS — R262 Difficulty in walking, not elsewhere classified: Secondary | ICD-10-CM | POA: Diagnosis not present

## 2016-02-10 DIAGNOSIS — M6281 Muscle weakness (generalized): Secondary | ICD-10-CM | POA: Diagnosis not present

## 2016-02-11 DIAGNOSIS — H903 Sensorineural hearing loss, bilateral: Secondary | ICD-10-CM | POA: Diagnosis not present

## 2016-02-11 DIAGNOSIS — C8280 Other types of follicular lymphoma, unspecified site: Secondary | ICD-10-CM | POA: Diagnosis not present

## 2016-02-11 DIAGNOSIS — E119 Type 2 diabetes mellitus without complications: Secondary | ICD-10-CM | POA: Diagnosis not present

## 2016-02-11 DIAGNOSIS — G2581 Restless legs syndrome: Secondary | ICD-10-CM | POA: Diagnosis not present

## 2016-02-11 DIAGNOSIS — G20 Parkinson's disease: Secondary | ICD-10-CM | POA: Diagnosis not present

## 2016-02-11 DIAGNOSIS — Z23 Encounter for immunization: Secondary | ICD-10-CM | POA: Diagnosis not present

## 2016-02-15 DIAGNOSIS — R262 Difficulty in walking, not elsewhere classified: Secondary | ICD-10-CM | POA: Diagnosis not present

## 2016-02-15 DIAGNOSIS — M6281 Muscle weakness (generalized): Secondary | ICD-10-CM | POA: Diagnosis not present

## 2016-02-17 DIAGNOSIS — R262 Difficulty in walking, not elsewhere classified: Secondary | ICD-10-CM | POA: Diagnosis not present

## 2016-02-17 DIAGNOSIS — M6281 Muscle weakness (generalized): Secondary | ICD-10-CM | POA: Diagnosis not present

## 2016-02-23 DIAGNOSIS — R262 Difficulty in walking, not elsewhere classified: Secondary | ICD-10-CM | POA: Diagnosis not present

## 2016-02-23 DIAGNOSIS — H903 Sensorineural hearing loss, bilateral: Secondary | ICD-10-CM | POA: Diagnosis not present

## 2016-02-23 DIAGNOSIS — M6281 Muscle weakness (generalized): Secondary | ICD-10-CM | POA: Diagnosis not present

## 2016-02-24 DIAGNOSIS — H903 Sensorineural hearing loss, bilateral: Secondary | ICD-10-CM | POA: Diagnosis not present

## 2016-02-24 DIAGNOSIS — H671 Otitis media in diseases classified elsewhere, right ear: Secondary | ICD-10-CM | POA: Diagnosis not present

## 2016-02-29 DIAGNOSIS — R262 Difficulty in walking, not elsewhere classified: Secondary | ICD-10-CM | POA: Diagnosis not present

## 2016-02-29 DIAGNOSIS — M6281 Muscle weakness (generalized): Secondary | ICD-10-CM | POA: Diagnosis not present

## 2016-03-01 DIAGNOSIS — Z9629 Presence of other otological and audiological implants: Secondary | ICD-10-CM | POA: Diagnosis not present

## 2016-03-01 DIAGNOSIS — H903 Sensorineural hearing loss, bilateral: Secondary | ICD-10-CM | POA: Diagnosis not present

## 2016-03-02 DIAGNOSIS — M6281 Muscle weakness (generalized): Secondary | ICD-10-CM | POA: Diagnosis not present

## 2016-03-02 DIAGNOSIS — R262 Difficulty in walking, not elsewhere classified: Secondary | ICD-10-CM | POA: Diagnosis not present

## 2016-03-07 DIAGNOSIS — R262 Difficulty in walking, not elsewhere classified: Secondary | ICD-10-CM | POA: Diagnosis not present

## 2016-03-07 DIAGNOSIS — M6281 Muscle weakness (generalized): Secondary | ICD-10-CM | POA: Diagnosis not present

## 2016-03-23 DIAGNOSIS — R262 Difficulty in walking, not elsewhere classified: Secondary | ICD-10-CM | POA: Diagnosis not present

## 2016-03-23 DIAGNOSIS — M6281 Muscle weakness (generalized): Secondary | ICD-10-CM | POA: Diagnosis not present

## 2016-03-27 DIAGNOSIS — R262 Difficulty in walking, not elsewhere classified: Secondary | ICD-10-CM | POA: Diagnosis not present

## 2016-03-27 DIAGNOSIS — M6281 Muscle weakness (generalized): Secondary | ICD-10-CM | POA: Diagnosis not present

## 2016-03-28 DIAGNOSIS — R262 Difficulty in walking, not elsewhere classified: Secondary | ICD-10-CM | POA: Diagnosis not present

## 2016-03-28 DIAGNOSIS — M6281 Muscle weakness (generalized): Secondary | ICD-10-CM | POA: Diagnosis not present

## 2016-03-28 IMAGING — RF DG SWALLOWING FUNCTION
1 series · 1 of 1 positions shown · non-contrast
Comparison: None.

CLINICAL DATA: Dysphasia. Pharyngo esophageal phase. Coughing with
swallowing

EXAM:
MODIFIED BARIUM SWALLOW
TECHNIQUE: Different consistencies of barium were administered orally to the
patient by the Speech Pathologist. Imaging of the pharynx was
performed in the lateral projection.
FLUOROSCOPY TIME:  Radiation Exposure Index (as provided by the
fluoroscopic device):
If the device does not provide the exposure index:
Fluoroscopy Time:  2 minutes 21 seconds
Number of Acquired Images:

[Series 1: run · 1 of 1 slices shown]
[im 1/1]
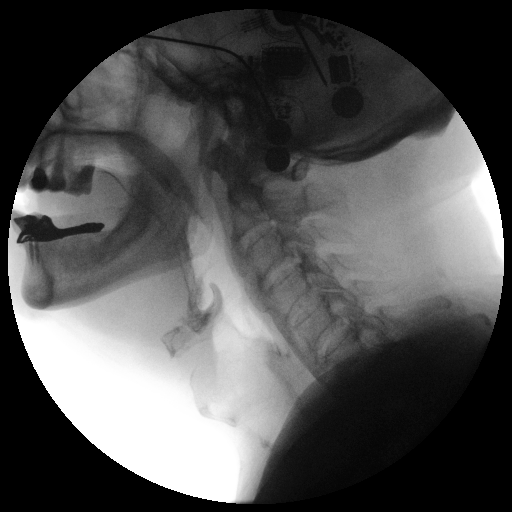

[1 of 1 positions shown; findings below may reference images not displayed]

FINDINGS: Thin liquid- Mild retention in the vallecula with multiple sips from
a straw. Normal clearing with cup sips. No penetration.

Nectar thick liquid- within normal limits

Honey- within normal limits

Brigit?Ceola within normal limits

Cracker-within normal limits

Brigit?[employer] with cracker- within normal limits

Barium tablet - barium tablet initially lodged in the vallecula.
When liquids were administered the barium tablet passed into the
esophagus and eventually into the stomach.
IMPRESSION: Mild retention in the valleculae with thin liquids. No penetration
or aspiration.

Please refer to the Speech Pathologists report for complete details
and recommendations.

## 2016-04-02 DIAGNOSIS — G379 Demyelinating disease of central nervous system, unspecified: Secondary | ICD-10-CM | POA: Diagnosis not present

## 2016-04-02 DIAGNOSIS — W0110XA Fall on same level from slipping, tripping and stumbling with subsequent striking against unspecified object, initial encounter: Secondary | ICD-10-CM | POA: Diagnosis not present

## 2016-04-02 DIAGNOSIS — E119 Type 2 diabetes mellitus without complications: Secondary | ICD-10-CM | POA: Diagnosis not present

## 2016-04-02 DIAGNOSIS — S0101XA Laceration without foreign body of scalp, initial encounter: Secondary | ICD-10-CM | POA: Diagnosis not present

## 2016-04-06 DIAGNOSIS — R262 Difficulty in walking, not elsewhere classified: Secondary | ICD-10-CM | POA: Diagnosis not present

## 2016-04-06 DIAGNOSIS — M6281 Muscle weakness (generalized): Secondary | ICD-10-CM | POA: Diagnosis not present

## 2016-04-11 DIAGNOSIS — R262 Difficulty in walking, not elsewhere classified: Secondary | ICD-10-CM | POA: Diagnosis not present

## 2016-04-11 DIAGNOSIS — M6281 Muscle weakness (generalized): Secondary | ICD-10-CM | POA: Diagnosis not present

## 2016-04-13 DIAGNOSIS — R262 Difficulty in walking, not elsewhere classified: Secondary | ICD-10-CM | POA: Diagnosis not present

## 2016-04-13 DIAGNOSIS — M6281 Muscle weakness (generalized): Secondary | ICD-10-CM | POA: Diagnosis not present

## 2016-04-19 DIAGNOSIS — B351 Tinea unguium: Secondary | ICD-10-CM | POA: Diagnosis not present

## 2016-04-19 DIAGNOSIS — Z79899 Other long term (current) drug therapy: Secondary | ICD-10-CM | POA: Diagnosis not present

## 2016-04-19 DIAGNOSIS — E119 Type 2 diabetes mellitus without complications: Secondary | ICD-10-CM | POA: Diagnosis not present

## 2016-04-19 DIAGNOSIS — R05 Cough: Secondary | ICD-10-CM | POA: Diagnosis not present

## 2016-04-19 DIAGNOSIS — J9811 Atelectasis: Secondary | ICD-10-CM | POA: Diagnosis not present

## 2016-04-19 DIAGNOSIS — Z7984 Long term (current) use of oral hypoglycemic drugs: Secondary | ICD-10-CM | POA: Diagnosis not present

## 2016-04-19 DIAGNOSIS — J101 Influenza due to other identified influenza virus with other respiratory manifestations: Secondary | ICD-10-CM | POA: Diagnosis not present

## 2016-04-19 DIAGNOSIS — G20 Parkinson's disease: Secondary | ICD-10-CM | POA: Diagnosis not present

## 2016-04-19 DIAGNOSIS — J1089 Influenza due to other identified influenza virus with other manifestations: Secondary | ICD-10-CM | POA: Diagnosis not present

## 2016-05-02 DIAGNOSIS — G231 Progressive supranuclear ophthalmoplegia [Steele-Richardson-Olszewski]: Secondary | ICD-10-CM | POA: Diagnosis not present

## 2016-05-02 DIAGNOSIS — K117 Disturbances of salivary secretion: Secondary | ICD-10-CM | POA: Diagnosis not present

## 2016-05-02 DIAGNOSIS — R2689 Other abnormalities of gait and mobility: Secondary | ICD-10-CM | POA: Diagnosis not present

## 2016-05-02 DIAGNOSIS — Z881 Allergy status to other antibiotic agents status: Secondary | ICD-10-CM | POA: Diagnosis not present

## 2016-05-02 DIAGNOSIS — R1319 Other dysphagia: Secondary | ICD-10-CM | POA: Diagnosis not present

## 2016-05-02 DIAGNOSIS — Z79899 Other long term (current) drug therapy: Secondary | ICD-10-CM | POA: Diagnosis not present

## 2016-05-02 DIAGNOSIS — R296 Repeated falls: Secondary | ICD-10-CM | POA: Diagnosis not present

## 2016-05-02 DIAGNOSIS — G20 Parkinson's disease: Secondary | ICD-10-CM | POA: Diagnosis not present

## 2016-05-02 DIAGNOSIS — G4710 Hypersomnia, unspecified: Secondary | ICD-10-CM | POA: Diagnosis not present

## 2016-05-06 DIAGNOSIS — R2689 Other abnormalities of gait and mobility: Secondary | ICD-10-CM | POA: Diagnosis not present

## 2016-05-06 DIAGNOSIS — M6281 Muscle weakness (generalized): Secondary | ICD-10-CM | POA: Diagnosis not present

## 2016-05-06 DIAGNOSIS — H9193 Unspecified hearing loss, bilateral: Secondary | ICD-10-CM | POA: Diagnosis not present

## 2016-05-06 DIAGNOSIS — E119 Type 2 diabetes mellitus without complications: Secondary | ICD-10-CM | POA: Diagnosis not present

## 2016-05-06 DIAGNOSIS — G20 Parkinson's disease: Secondary | ICD-10-CM | POA: Diagnosis not present

## 2016-05-06 DIAGNOSIS — R296 Repeated falls: Secondary | ICD-10-CM | POA: Diagnosis not present

## 2016-05-09 DIAGNOSIS — M6281 Muscle weakness (generalized): Secondary | ICD-10-CM | POA: Diagnosis not present

## 2016-05-09 DIAGNOSIS — R296 Repeated falls: Secondary | ICD-10-CM | POA: Diagnosis not present

## 2016-05-09 DIAGNOSIS — G20 Parkinson's disease: Secondary | ICD-10-CM | POA: Diagnosis not present

## 2016-05-09 DIAGNOSIS — E119 Type 2 diabetes mellitus without complications: Secondary | ICD-10-CM | POA: Diagnosis not present

## 2016-05-09 DIAGNOSIS — H9193 Unspecified hearing loss, bilateral: Secondary | ICD-10-CM | POA: Diagnosis not present

## 2016-05-09 DIAGNOSIS — R2689 Other abnormalities of gait and mobility: Secondary | ICD-10-CM | POA: Diagnosis not present

## 2016-05-10 DIAGNOSIS — E119 Type 2 diabetes mellitus without complications: Secondary | ICD-10-CM | POA: Diagnosis not present

## 2016-05-10 DIAGNOSIS — G20 Parkinson's disease: Secondary | ICD-10-CM | POA: Diagnosis not present

## 2016-05-10 DIAGNOSIS — R1310 Dysphagia, unspecified: Secondary | ICD-10-CM | POA: Diagnosis not present

## 2016-05-10 DIAGNOSIS — H905 Unspecified sensorineural hearing loss: Secondary | ICD-10-CM | POA: Diagnosis not present

## 2016-05-10 DIAGNOSIS — K219 Gastro-esophageal reflux disease without esophagitis: Secondary | ICD-10-CM | POA: Diagnosis not present

## 2016-05-10 DIAGNOSIS — C8280 Other types of follicular lymphoma, unspecified site: Secondary | ICD-10-CM | POA: Diagnosis not present

## 2016-05-12 DIAGNOSIS — C8280 Other types of follicular lymphoma, unspecified site: Secondary | ICD-10-CM | POA: Diagnosis not present

## 2016-05-12 DIAGNOSIS — G20 Parkinson's disease: Secondary | ICD-10-CM | POA: Diagnosis not present

## 2016-05-12 DIAGNOSIS — E119 Type 2 diabetes mellitus without complications: Secondary | ICD-10-CM | POA: Diagnosis not present

## 2016-05-12 DIAGNOSIS — R1310 Dysphagia, unspecified: Secondary | ICD-10-CM | POA: Diagnosis not present

## 2016-05-12 DIAGNOSIS — H905 Unspecified sensorineural hearing loss: Secondary | ICD-10-CM | POA: Diagnosis not present

## 2016-05-12 DIAGNOSIS — K219 Gastro-esophageal reflux disease without esophagitis: Secondary | ICD-10-CM | POA: Diagnosis not present

## 2016-05-15 DIAGNOSIS — C8280 Other types of follicular lymphoma, unspecified site: Secondary | ICD-10-CM | POA: Diagnosis not present

## 2016-05-15 DIAGNOSIS — E119 Type 2 diabetes mellitus without complications: Secondary | ICD-10-CM | POA: Diagnosis not present

## 2016-05-15 DIAGNOSIS — K219 Gastro-esophageal reflux disease without esophagitis: Secondary | ICD-10-CM | POA: Diagnosis not present

## 2016-05-15 DIAGNOSIS — G20 Parkinson's disease: Secondary | ICD-10-CM | POA: Diagnosis not present

## 2016-05-15 DIAGNOSIS — R1310 Dysphagia, unspecified: Secondary | ICD-10-CM | POA: Diagnosis not present

## 2016-05-15 DIAGNOSIS — H905 Unspecified sensorineural hearing loss: Secondary | ICD-10-CM | POA: Diagnosis not present

## 2016-05-16 DIAGNOSIS — G20 Parkinson's disease: Secondary | ICD-10-CM | POA: Diagnosis not present

## 2016-05-16 DIAGNOSIS — R1319 Other dysphagia: Secondary | ICD-10-CM | POA: Diagnosis not present

## 2016-05-16 DIAGNOSIS — R1310 Dysphagia, unspecified: Secondary | ICD-10-CM | POA: Diagnosis not present

## 2016-05-17 DIAGNOSIS — R1310 Dysphagia, unspecified: Secondary | ICD-10-CM | POA: Diagnosis not present

## 2016-05-17 DIAGNOSIS — K219 Gastro-esophageal reflux disease without esophagitis: Secondary | ICD-10-CM | POA: Diagnosis not present

## 2016-05-17 DIAGNOSIS — E119 Type 2 diabetes mellitus without complications: Secondary | ICD-10-CM | POA: Diagnosis not present

## 2016-05-17 DIAGNOSIS — G20 Parkinson's disease: Secondary | ICD-10-CM | POA: Diagnosis not present

## 2016-05-17 DIAGNOSIS — H905 Unspecified sensorineural hearing loss: Secondary | ICD-10-CM | POA: Diagnosis not present

## 2016-05-17 DIAGNOSIS — C8280 Other types of follicular lymphoma, unspecified site: Secondary | ICD-10-CM | POA: Diagnosis not present

## 2016-05-18 DIAGNOSIS — E119 Type 2 diabetes mellitus without complications: Secondary | ICD-10-CM | POA: Diagnosis not present

## 2016-05-18 DIAGNOSIS — H905 Unspecified sensorineural hearing loss: Secondary | ICD-10-CM | POA: Diagnosis not present

## 2016-05-18 DIAGNOSIS — C8280 Other types of follicular lymphoma, unspecified site: Secondary | ICD-10-CM | POA: Diagnosis not present

## 2016-05-18 DIAGNOSIS — K219 Gastro-esophageal reflux disease without esophagitis: Secondary | ICD-10-CM | POA: Diagnosis not present

## 2016-05-18 DIAGNOSIS — G20 Parkinson's disease: Secondary | ICD-10-CM | POA: Diagnosis not present

## 2016-05-18 DIAGNOSIS — R1310 Dysphagia, unspecified: Secondary | ICD-10-CM | POA: Diagnosis not present

## 2016-05-19 DIAGNOSIS — G20 Parkinson's disease: Secondary | ICD-10-CM | POA: Diagnosis not present

## 2016-05-19 DIAGNOSIS — K219 Gastro-esophageal reflux disease without esophagitis: Secondary | ICD-10-CM | POA: Diagnosis not present

## 2016-05-19 DIAGNOSIS — C8280 Other types of follicular lymphoma, unspecified site: Secondary | ICD-10-CM | POA: Diagnosis not present

## 2016-05-19 DIAGNOSIS — H905 Unspecified sensorineural hearing loss: Secondary | ICD-10-CM | POA: Diagnosis not present

## 2016-05-19 DIAGNOSIS — E119 Type 2 diabetes mellitus without complications: Secondary | ICD-10-CM | POA: Diagnosis not present

## 2016-05-19 DIAGNOSIS — R1310 Dysphagia, unspecified: Secondary | ICD-10-CM | POA: Diagnosis not present

## 2016-05-22 DIAGNOSIS — R1310 Dysphagia, unspecified: Secondary | ICD-10-CM | POA: Diagnosis not present

## 2016-05-22 DIAGNOSIS — E119 Type 2 diabetes mellitus without complications: Secondary | ICD-10-CM | POA: Diagnosis not present

## 2016-05-22 DIAGNOSIS — H905 Unspecified sensorineural hearing loss: Secondary | ICD-10-CM | POA: Diagnosis not present

## 2016-05-22 DIAGNOSIS — K219 Gastro-esophageal reflux disease without esophagitis: Secondary | ICD-10-CM | POA: Diagnosis not present

## 2016-05-22 DIAGNOSIS — C8280 Other types of follicular lymphoma, unspecified site: Secondary | ICD-10-CM | POA: Diagnosis not present

## 2016-05-22 DIAGNOSIS — G20 Parkinson's disease: Secondary | ICD-10-CM | POA: Diagnosis not present

## 2016-05-25 DIAGNOSIS — K219 Gastro-esophageal reflux disease without esophagitis: Secondary | ICD-10-CM | POA: Diagnosis not present

## 2016-05-25 DIAGNOSIS — G20 Parkinson's disease: Secondary | ICD-10-CM | POA: Diagnosis not present

## 2016-05-25 DIAGNOSIS — C8280 Other types of follicular lymphoma, unspecified site: Secondary | ICD-10-CM | POA: Diagnosis not present

## 2016-05-25 DIAGNOSIS — H905 Unspecified sensorineural hearing loss: Secondary | ICD-10-CM | POA: Diagnosis not present

## 2016-05-25 DIAGNOSIS — E119 Type 2 diabetes mellitus without complications: Secondary | ICD-10-CM | POA: Diagnosis not present

## 2016-05-25 DIAGNOSIS — R1310 Dysphagia, unspecified: Secondary | ICD-10-CM | POA: Diagnosis not present

## 2016-05-26 DIAGNOSIS — H905 Unspecified sensorineural hearing loss: Secondary | ICD-10-CM | POA: Diagnosis not present

## 2016-05-26 DIAGNOSIS — K219 Gastro-esophageal reflux disease without esophagitis: Secondary | ICD-10-CM | POA: Diagnosis not present

## 2016-05-26 DIAGNOSIS — E119 Type 2 diabetes mellitus without complications: Secondary | ICD-10-CM | POA: Diagnosis not present

## 2016-05-26 DIAGNOSIS — G20 Parkinson's disease: Secondary | ICD-10-CM | POA: Diagnosis not present

## 2016-05-26 DIAGNOSIS — C8280 Other types of follicular lymphoma, unspecified site: Secondary | ICD-10-CM | POA: Diagnosis not present

## 2016-05-26 DIAGNOSIS — R1310 Dysphagia, unspecified: Secondary | ICD-10-CM | POA: Diagnosis not present

## 2016-05-30 DIAGNOSIS — H905 Unspecified sensorineural hearing loss: Secondary | ICD-10-CM | POA: Diagnosis not present

## 2016-05-30 DIAGNOSIS — G20 Parkinson's disease: Secondary | ICD-10-CM | POA: Diagnosis not present

## 2016-05-30 DIAGNOSIS — C8280 Other types of follicular lymphoma, unspecified site: Secondary | ICD-10-CM | POA: Diagnosis not present

## 2016-05-30 DIAGNOSIS — E119 Type 2 diabetes mellitus without complications: Secondary | ICD-10-CM | POA: Diagnosis not present

## 2016-05-30 DIAGNOSIS — K219 Gastro-esophageal reflux disease without esophagitis: Secondary | ICD-10-CM | POA: Diagnosis not present

## 2016-05-30 DIAGNOSIS — R1310 Dysphagia, unspecified: Secondary | ICD-10-CM | POA: Diagnosis not present

## 2016-05-31 DIAGNOSIS — H905 Unspecified sensorineural hearing loss: Secondary | ICD-10-CM | POA: Diagnosis not present

## 2016-05-31 DIAGNOSIS — R1310 Dysphagia, unspecified: Secondary | ICD-10-CM | POA: Diagnosis not present

## 2016-05-31 DIAGNOSIS — G20 Parkinson's disease: Secondary | ICD-10-CM | POA: Diagnosis not present

## 2016-05-31 DIAGNOSIS — K219 Gastro-esophageal reflux disease without esophagitis: Secondary | ICD-10-CM | POA: Diagnosis not present

## 2016-05-31 DIAGNOSIS — C8280 Other types of follicular lymphoma, unspecified site: Secondary | ICD-10-CM | POA: Diagnosis not present

## 2016-05-31 DIAGNOSIS — E119 Type 2 diabetes mellitus without complications: Secondary | ICD-10-CM | POA: Diagnosis not present

## 2016-06-01 DIAGNOSIS — C8280 Other types of follicular lymphoma, unspecified site: Secondary | ICD-10-CM | POA: Diagnosis not present

## 2016-06-01 DIAGNOSIS — H905 Unspecified sensorineural hearing loss: Secondary | ICD-10-CM | POA: Diagnosis not present

## 2016-06-01 DIAGNOSIS — R1310 Dysphagia, unspecified: Secondary | ICD-10-CM | POA: Diagnosis not present

## 2016-06-01 DIAGNOSIS — G20 Parkinson's disease: Secondary | ICD-10-CM | POA: Diagnosis not present

## 2016-06-01 DIAGNOSIS — E119 Type 2 diabetes mellitus without complications: Secondary | ICD-10-CM | POA: Diagnosis not present

## 2016-06-01 DIAGNOSIS — K219 Gastro-esophageal reflux disease without esophagitis: Secondary | ICD-10-CM | POA: Diagnosis not present

## 2016-06-06 DIAGNOSIS — G20 Parkinson's disease: Secondary | ICD-10-CM | POA: Diagnosis not present

## 2016-06-06 DIAGNOSIS — C8280 Other types of follicular lymphoma, unspecified site: Secondary | ICD-10-CM | POA: Diagnosis not present

## 2016-06-06 DIAGNOSIS — E119 Type 2 diabetes mellitus without complications: Secondary | ICD-10-CM | POA: Diagnosis not present

## 2016-06-06 DIAGNOSIS — H905 Unspecified sensorineural hearing loss: Secondary | ICD-10-CM | POA: Diagnosis not present

## 2016-06-06 DIAGNOSIS — K219 Gastro-esophageal reflux disease without esophagitis: Secondary | ICD-10-CM | POA: Diagnosis not present

## 2016-06-06 DIAGNOSIS — R1310 Dysphagia, unspecified: Secondary | ICD-10-CM | POA: Diagnosis not present

## 2016-06-08 DIAGNOSIS — K219 Gastro-esophageal reflux disease without esophagitis: Secondary | ICD-10-CM | POA: Diagnosis not present

## 2016-06-08 DIAGNOSIS — C8280 Other types of follicular lymphoma, unspecified site: Secondary | ICD-10-CM | POA: Diagnosis not present

## 2016-06-08 DIAGNOSIS — H905 Unspecified sensorineural hearing loss: Secondary | ICD-10-CM | POA: Diagnosis not present

## 2016-06-08 DIAGNOSIS — R1310 Dysphagia, unspecified: Secondary | ICD-10-CM | POA: Diagnosis not present

## 2016-06-08 DIAGNOSIS — G20 Parkinson's disease: Secondary | ICD-10-CM | POA: Diagnosis not present

## 2016-06-08 DIAGNOSIS — E119 Type 2 diabetes mellitus without complications: Secondary | ICD-10-CM | POA: Diagnosis not present

## 2016-06-09 DIAGNOSIS — C8280 Other types of follicular lymphoma, unspecified site: Secondary | ICD-10-CM | POA: Diagnosis not present

## 2016-06-09 DIAGNOSIS — K219 Gastro-esophageal reflux disease without esophagitis: Secondary | ICD-10-CM | POA: Diagnosis not present

## 2016-06-09 DIAGNOSIS — E119 Type 2 diabetes mellitus without complications: Secondary | ICD-10-CM | POA: Diagnosis not present

## 2016-06-09 DIAGNOSIS — R1310 Dysphagia, unspecified: Secondary | ICD-10-CM | POA: Diagnosis not present

## 2016-06-09 DIAGNOSIS — H905 Unspecified sensorineural hearing loss: Secondary | ICD-10-CM | POA: Diagnosis not present

## 2016-06-09 DIAGNOSIS — G20 Parkinson's disease: Secondary | ICD-10-CM | POA: Diagnosis not present

## 2016-06-12 DIAGNOSIS — E119 Type 2 diabetes mellitus without complications: Secondary | ICD-10-CM | POA: Diagnosis not present

## 2016-06-12 DIAGNOSIS — R1310 Dysphagia, unspecified: Secondary | ICD-10-CM | POA: Diagnosis not present

## 2016-06-12 DIAGNOSIS — K219 Gastro-esophageal reflux disease without esophagitis: Secondary | ICD-10-CM | POA: Diagnosis not present

## 2016-06-12 DIAGNOSIS — C8280 Other types of follicular lymphoma, unspecified site: Secondary | ICD-10-CM | POA: Diagnosis not present

## 2016-06-12 DIAGNOSIS — G20 Parkinson's disease: Secondary | ICD-10-CM | POA: Diagnosis not present

## 2016-06-12 DIAGNOSIS — H905 Unspecified sensorineural hearing loss: Secondary | ICD-10-CM | POA: Diagnosis not present

## 2016-06-13 DIAGNOSIS — R1310 Dysphagia, unspecified: Secondary | ICD-10-CM | POA: Diagnosis not present

## 2016-06-13 DIAGNOSIS — H905 Unspecified sensorineural hearing loss: Secondary | ICD-10-CM | POA: Diagnosis not present

## 2016-06-13 DIAGNOSIS — K219 Gastro-esophageal reflux disease without esophagitis: Secondary | ICD-10-CM | POA: Diagnosis not present

## 2016-06-13 DIAGNOSIS — G20 Parkinson's disease: Secondary | ICD-10-CM | POA: Diagnosis not present

## 2016-06-13 DIAGNOSIS — C8280 Other types of follicular lymphoma, unspecified site: Secondary | ICD-10-CM | POA: Diagnosis not present

## 2016-06-13 DIAGNOSIS — E119 Type 2 diabetes mellitus without complications: Secondary | ICD-10-CM | POA: Diagnosis not present

## 2016-06-20 DIAGNOSIS — C8280 Other types of follicular lymphoma, unspecified site: Secondary | ICD-10-CM | POA: Diagnosis not present

## 2016-06-20 DIAGNOSIS — H905 Unspecified sensorineural hearing loss: Secondary | ICD-10-CM | POA: Diagnosis not present

## 2016-06-20 DIAGNOSIS — G20 Parkinson's disease: Secondary | ICD-10-CM | POA: Diagnosis not present

## 2016-06-20 DIAGNOSIS — E119 Type 2 diabetes mellitus without complications: Secondary | ICD-10-CM | POA: Diagnosis not present

## 2016-06-20 DIAGNOSIS — K219 Gastro-esophageal reflux disease without esophagitis: Secondary | ICD-10-CM | POA: Diagnosis not present

## 2016-06-20 DIAGNOSIS — R1310 Dysphagia, unspecified: Secondary | ICD-10-CM | POA: Diagnosis not present

## 2016-06-21 DIAGNOSIS — R1310 Dysphagia, unspecified: Secondary | ICD-10-CM | POA: Diagnosis not present

## 2016-06-21 DIAGNOSIS — C8280 Other types of follicular lymphoma, unspecified site: Secondary | ICD-10-CM | POA: Diagnosis not present

## 2016-06-21 DIAGNOSIS — H905 Unspecified sensorineural hearing loss: Secondary | ICD-10-CM | POA: Diagnosis not present

## 2016-06-21 DIAGNOSIS — K219 Gastro-esophageal reflux disease without esophagitis: Secondary | ICD-10-CM | POA: Diagnosis not present

## 2016-06-21 DIAGNOSIS — G20 Parkinson's disease: Secondary | ICD-10-CM | POA: Diagnosis not present

## 2016-06-21 DIAGNOSIS — E119 Type 2 diabetes mellitus without complications: Secondary | ICD-10-CM | POA: Diagnosis not present

## 2016-06-22 DIAGNOSIS — H905 Unspecified sensorineural hearing loss: Secondary | ICD-10-CM | POA: Diagnosis not present

## 2016-06-22 DIAGNOSIS — G20 Parkinson's disease: Secondary | ICD-10-CM | POA: Diagnosis not present

## 2016-06-22 DIAGNOSIS — C8280 Other types of follicular lymphoma, unspecified site: Secondary | ICD-10-CM | POA: Diagnosis not present

## 2016-06-22 DIAGNOSIS — R1310 Dysphagia, unspecified: Secondary | ICD-10-CM | POA: Diagnosis not present

## 2016-06-22 DIAGNOSIS — K219 Gastro-esophageal reflux disease without esophagitis: Secondary | ICD-10-CM | POA: Diagnosis not present

## 2016-06-22 DIAGNOSIS — E119 Type 2 diabetes mellitus without complications: Secondary | ICD-10-CM | POA: Diagnosis not present

## 2016-06-26 DIAGNOSIS — R1310 Dysphagia, unspecified: Secondary | ICD-10-CM | POA: Diagnosis not present

## 2016-06-26 DIAGNOSIS — E119 Type 2 diabetes mellitus without complications: Secondary | ICD-10-CM | POA: Diagnosis not present

## 2016-06-26 DIAGNOSIS — C8280 Other types of follicular lymphoma, unspecified site: Secondary | ICD-10-CM | POA: Diagnosis not present

## 2016-06-26 DIAGNOSIS — H905 Unspecified sensorineural hearing loss: Secondary | ICD-10-CM | POA: Diagnosis not present

## 2016-06-26 DIAGNOSIS — K219 Gastro-esophageal reflux disease without esophagitis: Secondary | ICD-10-CM | POA: Diagnosis not present

## 2016-06-26 DIAGNOSIS — G20 Parkinson's disease: Secondary | ICD-10-CM | POA: Diagnosis not present

## 2016-06-27 DIAGNOSIS — K219 Gastro-esophageal reflux disease without esophagitis: Secondary | ICD-10-CM | POA: Diagnosis not present

## 2016-06-27 DIAGNOSIS — H905 Unspecified sensorineural hearing loss: Secondary | ICD-10-CM | POA: Diagnosis not present

## 2016-06-27 DIAGNOSIS — G20 Parkinson's disease: Secondary | ICD-10-CM | POA: Diagnosis not present

## 2016-06-27 DIAGNOSIS — E119 Type 2 diabetes mellitus without complications: Secondary | ICD-10-CM | POA: Diagnosis not present

## 2016-06-27 DIAGNOSIS — C8280 Other types of follicular lymphoma, unspecified site: Secondary | ICD-10-CM | POA: Diagnosis not present

## 2016-06-27 DIAGNOSIS — R1310 Dysphagia, unspecified: Secondary | ICD-10-CM | POA: Diagnosis not present

## 2016-06-29 DIAGNOSIS — C8280 Other types of follicular lymphoma, unspecified site: Secondary | ICD-10-CM | POA: Diagnosis not present

## 2016-06-29 DIAGNOSIS — R1310 Dysphagia, unspecified: Secondary | ICD-10-CM | POA: Diagnosis not present

## 2016-06-29 DIAGNOSIS — G20 Parkinson's disease: Secondary | ICD-10-CM | POA: Diagnosis not present

## 2016-06-29 DIAGNOSIS — H905 Unspecified sensorineural hearing loss: Secondary | ICD-10-CM | POA: Diagnosis not present

## 2016-06-29 DIAGNOSIS — E119 Type 2 diabetes mellitus without complications: Secondary | ICD-10-CM | POA: Diagnosis not present

## 2016-06-29 DIAGNOSIS — K219 Gastro-esophageal reflux disease without esophagitis: Secondary | ICD-10-CM | POA: Diagnosis not present

## 2016-07-03 DIAGNOSIS — C8280 Other types of follicular lymphoma, unspecified site: Secondary | ICD-10-CM | POA: Diagnosis not present

## 2016-07-03 DIAGNOSIS — G20 Parkinson's disease: Secondary | ICD-10-CM | POA: Diagnosis not present

## 2016-07-03 DIAGNOSIS — E119 Type 2 diabetes mellitus without complications: Secondary | ICD-10-CM | POA: Diagnosis not present

## 2016-07-03 DIAGNOSIS — K219 Gastro-esophageal reflux disease without esophagitis: Secondary | ICD-10-CM | POA: Diagnosis not present

## 2016-07-03 DIAGNOSIS — H905 Unspecified sensorineural hearing loss: Secondary | ICD-10-CM | POA: Diagnosis not present

## 2016-07-03 DIAGNOSIS — R1310 Dysphagia, unspecified: Secondary | ICD-10-CM | POA: Diagnosis not present

## 2016-07-04 DIAGNOSIS — K219 Gastro-esophageal reflux disease without esophagitis: Secondary | ICD-10-CM | POA: Diagnosis not present

## 2016-07-04 DIAGNOSIS — C8280 Other types of follicular lymphoma, unspecified site: Secondary | ICD-10-CM | POA: Diagnosis not present

## 2016-07-04 DIAGNOSIS — R1310 Dysphagia, unspecified: Secondary | ICD-10-CM | POA: Diagnosis not present

## 2016-07-04 DIAGNOSIS — E119 Type 2 diabetes mellitus without complications: Secondary | ICD-10-CM | POA: Diagnosis not present

## 2016-07-04 DIAGNOSIS — G20 Parkinson's disease: Secondary | ICD-10-CM | POA: Diagnosis not present

## 2016-07-04 DIAGNOSIS — H905 Unspecified sensorineural hearing loss: Secondary | ICD-10-CM | POA: Diagnosis not present

## 2016-07-09 DIAGNOSIS — K219 Gastro-esophageal reflux disease without esophagitis: Secondary | ICD-10-CM | POA: Diagnosis not present

## 2016-07-09 DIAGNOSIS — E119 Type 2 diabetes mellitus without complications: Secondary | ICD-10-CM | POA: Diagnosis not present

## 2016-07-09 DIAGNOSIS — H905 Unspecified sensorineural hearing loss: Secondary | ICD-10-CM | POA: Diagnosis not present

## 2016-07-09 DIAGNOSIS — G20 Parkinson's disease: Secondary | ICD-10-CM | POA: Diagnosis not present

## 2016-07-09 DIAGNOSIS — C8280 Other types of follicular lymphoma, unspecified site: Secondary | ICD-10-CM | POA: Diagnosis not present

## 2016-07-09 DIAGNOSIS — R1310 Dysphagia, unspecified: Secondary | ICD-10-CM | POA: Diagnosis not present

## 2016-07-12 DIAGNOSIS — H905 Unspecified sensorineural hearing loss: Secondary | ICD-10-CM | POA: Diagnosis not present

## 2016-07-12 DIAGNOSIS — E119 Type 2 diabetes mellitus without complications: Secondary | ICD-10-CM | POA: Diagnosis not present

## 2016-07-12 DIAGNOSIS — C8280 Other types of follicular lymphoma, unspecified site: Secondary | ICD-10-CM | POA: Diagnosis not present

## 2016-07-12 DIAGNOSIS — R1310 Dysphagia, unspecified: Secondary | ICD-10-CM | POA: Diagnosis not present

## 2016-07-12 DIAGNOSIS — K219 Gastro-esophageal reflux disease without esophagitis: Secondary | ICD-10-CM | POA: Diagnosis not present

## 2016-07-12 DIAGNOSIS — G20 Parkinson's disease: Secondary | ICD-10-CM | POA: Diagnosis not present

## 2016-07-14 DIAGNOSIS — C8280 Other types of follicular lymphoma, unspecified site: Secondary | ICD-10-CM | POA: Diagnosis not present

## 2016-07-14 DIAGNOSIS — G20 Parkinson's disease: Secondary | ICD-10-CM | POA: Diagnosis not present

## 2016-07-14 DIAGNOSIS — K219 Gastro-esophageal reflux disease without esophagitis: Secondary | ICD-10-CM | POA: Diagnosis not present

## 2016-07-14 DIAGNOSIS — H905 Unspecified sensorineural hearing loss: Secondary | ICD-10-CM | POA: Diagnosis not present

## 2016-07-14 DIAGNOSIS — E119 Type 2 diabetes mellitus without complications: Secondary | ICD-10-CM | POA: Diagnosis not present

## 2016-07-14 DIAGNOSIS — R1310 Dysphagia, unspecified: Secondary | ICD-10-CM | POA: Diagnosis not present

## 2016-07-18 DIAGNOSIS — H905 Unspecified sensorineural hearing loss: Secondary | ICD-10-CM | POA: Diagnosis not present

## 2016-07-18 DIAGNOSIS — K219 Gastro-esophageal reflux disease without esophagitis: Secondary | ICD-10-CM | POA: Diagnosis not present

## 2016-07-18 DIAGNOSIS — E119 Type 2 diabetes mellitus without complications: Secondary | ICD-10-CM | POA: Diagnosis not present

## 2016-07-18 DIAGNOSIS — G20 Parkinson's disease: Secondary | ICD-10-CM | POA: Diagnosis not present

## 2016-07-18 DIAGNOSIS — C8280 Other types of follicular lymphoma, unspecified site: Secondary | ICD-10-CM | POA: Diagnosis not present

## 2016-07-18 DIAGNOSIS — R1310 Dysphagia, unspecified: Secondary | ICD-10-CM | POA: Diagnosis not present

## 2016-07-22 DIAGNOSIS — C8280 Other types of follicular lymphoma, unspecified site: Secondary | ICD-10-CM | POA: Diagnosis not present

## 2016-07-22 DIAGNOSIS — E119 Type 2 diabetes mellitus without complications: Secondary | ICD-10-CM | POA: Diagnosis not present

## 2016-07-22 DIAGNOSIS — H905 Unspecified sensorineural hearing loss: Secondary | ICD-10-CM | POA: Diagnosis not present

## 2016-07-22 DIAGNOSIS — G20 Parkinson's disease: Secondary | ICD-10-CM | POA: Diagnosis not present

## 2016-07-22 DIAGNOSIS — K219 Gastro-esophageal reflux disease without esophagitis: Secondary | ICD-10-CM | POA: Diagnosis not present

## 2016-07-22 DIAGNOSIS — R1310 Dysphagia, unspecified: Secondary | ICD-10-CM | POA: Diagnosis not present

## 2016-07-25 DIAGNOSIS — G20 Parkinson's disease: Secondary | ICD-10-CM | POA: Diagnosis not present

## 2016-07-25 DIAGNOSIS — J4530 Mild persistent asthma, uncomplicated: Secondary | ICD-10-CM | POA: Diagnosis not present

## 2016-07-25 DIAGNOSIS — H905 Unspecified sensorineural hearing loss: Secondary | ICD-10-CM | POA: Diagnosis not present

## 2016-07-25 DIAGNOSIS — E119 Type 2 diabetes mellitus without complications: Secondary | ICD-10-CM | POA: Diagnosis not present

## 2016-07-25 DIAGNOSIS — K219 Gastro-esophageal reflux disease without esophagitis: Secondary | ICD-10-CM | POA: Diagnosis not present

## 2016-07-25 DIAGNOSIS — C8280 Other types of follicular lymphoma, unspecified site: Secondary | ICD-10-CM | POA: Diagnosis not present

## 2016-07-25 DIAGNOSIS — R1310 Dysphagia, unspecified: Secondary | ICD-10-CM | POA: Diagnosis not present

## 2016-07-26 DIAGNOSIS — E119 Type 2 diabetes mellitus without complications: Secondary | ICD-10-CM | POA: Diagnosis not present

## 2016-07-26 DIAGNOSIS — I10 Essential (primary) hypertension: Secondary | ICD-10-CM | POA: Diagnosis not present

## 2016-07-27 DIAGNOSIS — H905 Unspecified sensorineural hearing loss: Secondary | ICD-10-CM | POA: Diagnosis not present

## 2016-07-27 DIAGNOSIS — E119 Type 2 diabetes mellitus without complications: Secondary | ICD-10-CM | POA: Diagnosis not present

## 2016-07-27 DIAGNOSIS — C8280 Other types of follicular lymphoma, unspecified site: Secondary | ICD-10-CM | POA: Diagnosis not present

## 2016-07-27 DIAGNOSIS — K219 Gastro-esophageal reflux disease without esophagitis: Secondary | ICD-10-CM | POA: Diagnosis not present

## 2016-07-27 DIAGNOSIS — R1310 Dysphagia, unspecified: Secondary | ICD-10-CM | POA: Diagnosis not present

## 2016-07-27 DIAGNOSIS — G20 Parkinson's disease: Secondary | ICD-10-CM | POA: Diagnosis not present

## 2016-08-01 DIAGNOSIS — R1310 Dysphagia, unspecified: Secondary | ICD-10-CM | POA: Diagnosis not present

## 2016-08-01 DIAGNOSIS — K219 Gastro-esophageal reflux disease without esophagitis: Secondary | ICD-10-CM | POA: Diagnosis not present

## 2016-08-01 DIAGNOSIS — E119 Type 2 diabetes mellitus without complications: Secondary | ICD-10-CM | POA: Diagnosis not present

## 2016-08-01 DIAGNOSIS — H905 Unspecified sensorineural hearing loss: Secondary | ICD-10-CM | POA: Diagnosis not present

## 2016-08-01 DIAGNOSIS — C8280 Other types of follicular lymphoma, unspecified site: Secondary | ICD-10-CM | POA: Diagnosis not present

## 2016-08-01 DIAGNOSIS — G20 Parkinson's disease: Secondary | ICD-10-CM | POA: Diagnosis not present

## 2016-08-02 DIAGNOSIS — K117 Disturbances of salivary secretion: Secondary | ICD-10-CM | POA: Diagnosis not present

## 2016-08-03 DIAGNOSIS — E119 Type 2 diabetes mellitus without complications: Secondary | ICD-10-CM | POA: Diagnosis not present

## 2016-08-03 DIAGNOSIS — C8280 Other types of follicular lymphoma, unspecified site: Secondary | ICD-10-CM | POA: Diagnosis not present

## 2016-08-03 DIAGNOSIS — G20 Parkinson's disease: Secondary | ICD-10-CM | POA: Diagnosis not present

## 2016-08-03 DIAGNOSIS — R1310 Dysphagia, unspecified: Secondary | ICD-10-CM | POA: Diagnosis not present

## 2016-08-03 DIAGNOSIS — K219 Gastro-esophageal reflux disease without esophagitis: Secondary | ICD-10-CM | POA: Diagnosis not present

## 2016-08-03 DIAGNOSIS — H905 Unspecified sensorineural hearing loss: Secondary | ICD-10-CM | POA: Diagnosis not present

## 2016-08-09 DIAGNOSIS — C8280 Other types of follicular lymphoma, unspecified site: Secondary | ICD-10-CM | POA: Diagnosis not present

## 2016-08-09 DIAGNOSIS — K219 Gastro-esophageal reflux disease without esophagitis: Secondary | ICD-10-CM | POA: Diagnosis not present

## 2016-08-09 DIAGNOSIS — H905 Unspecified sensorineural hearing loss: Secondary | ICD-10-CM | POA: Diagnosis not present

## 2016-08-09 DIAGNOSIS — R1310 Dysphagia, unspecified: Secondary | ICD-10-CM | POA: Diagnosis not present

## 2016-08-09 DIAGNOSIS — E119 Type 2 diabetes mellitus without complications: Secondary | ICD-10-CM | POA: Diagnosis not present

## 2016-08-09 DIAGNOSIS — G20 Parkinson's disease: Secondary | ICD-10-CM | POA: Diagnosis not present

## 2016-08-11 DIAGNOSIS — E119 Type 2 diabetes mellitus without complications: Secondary | ICD-10-CM | POA: Diagnosis not present

## 2016-08-11 DIAGNOSIS — R1310 Dysphagia, unspecified: Secondary | ICD-10-CM | POA: Diagnosis not present

## 2016-08-11 DIAGNOSIS — G20 Parkinson's disease: Secondary | ICD-10-CM | POA: Diagnosis not present

## 2016-08-11 DIAGNOSIS — C8280 Other types of follicular lymphoma, unspecified site: Secondary | ICD-10-CM | POA: Diagnosis not present

## 2016-08-11 DIAGNOSIS — K219 Gastro-esophageal reflux disease without esophagitis: Secondary | ICD-10-CM | POA: Diagnosis not present

## 2016-08-11 DIAGNOSIS — H905 Unspecified sensorineural hearing loss: Secondary | ICD-10-CM | POA: Diagnosis not present

## 2016-08-14 DIAGNOSIS — G20 Parkinson's disease: Secondary | ICD-10-CM | POA: Diagnosis not present

## 2016-08-14 DIAGNOSIS — E119 Type 2 diabetes mellitus without complications: Secondary | ICD-10-CM | POA: Diagnosis not present

## 2016-08-14 DIAGNOSIS — C8280 Other types of follicular lymphoma, unspecified site: Secondary | ICD-10-CM | POA: Diagnosis not present

## 2016-08-14 DIAGNOSIS — H905 Unspecified sensorineural hearing loss: Secondary | ICD-10-CM | POA: Diagnosis not present

## 2016-08-14 DIAGNOSIS — K219 Gastro-esophageal reflux disease without esophagitis: Secondary | ICD-10-CM | POA: Diagnosis not present

## 2016-08-14 DIAGNOSIS — R1310 Dysphagia, unspecified: Secondary | ICD-10-CM | POA: Diagnosis not present

## 2016-08-17 DIAGNOSIS — E119 Type 2 diabetes mellitus without complications: Secondary | ICD-10-CM | POA: Diagnosis not present

## 2016-08-17 DIAGNOSIS — C8280 Other types of follicular lymphoma, unspecified site: Secondary | ICD-10-CM | POA: Diagnosis not present

## 2016-08-17 DIAGNOSIS — K219 Gastro-esophageal reflux disease without esophagitis: Secondary | ICD-10-CM | POA: Diagnosis not present

## 2016-08-17 DIAGNOSIS — H905 Unspecified sensorineural hearing loss: Secondary | ICD-10-CM | POA: Diagnosis not present

## 2016-08-17 DIAGNOSIS — R1310 Dysphagia, unspecified: Secondary | ICD-10-CM | POA: Diagnosis not present

## 2016-08-17 DIAGNOSIS — G20 Parkinson's disease: Secondary | ICD-10-CM | POA: Diagnosis not present

## 2016-08-18 DIAGNOSIS — H903 Sensorineural hearing loss, bilateral: Secondary | ICD-10-CM | POA: Diagnosis not present

## 2016-08-22 DIAGNOSIS — H903 Sensorineural hearing loss, bilateral: Secondary | ICD-10-CM | POA: Diagnosis not present

## 2016-08-24 DIAGNOSIS — H903 Sensorineural hearing loss, bilateral: Secondary | ICD-10-CM | POA: Diagnosis not present

## 2016-10-11 DIAGNOSIS — H903 Sensorineural hearing loss, bilateral: Secondary | ICD-10-CM | POA: Diagnosis not present

## 2016-11-13 DIAGNOSIS — E119 Type 2 diabetes mellitus without complications: Secondary | ICD-10-CM | POA: Diagnosis not present

## 2016-11-13 DIAGNOSIS — J4530 Mild persistent asthma, uncomplicated: Secondary | ICD-10-CM | POA: Diagnosis not present

## 2016-11-13 DIAGNOSIS — Z125 Encounter for screening for malignant neoplasm of prostate: Secondary | ICD-10-CM | POA: Diagnosis not present

## 2016-11-13 DIAGNOSIS — I872 Venous insufficiency (chronic) (peripheral): Secondary | ICD-10-CM | POA: Diagnosis not present

## 2016-11-22 DIAGNOSIS — R972 Elevated prostate specific antigen [PSA]: Secondary | ICD-10-CM | POA: Diagnosis not present

## 2016-12-06 DIAGNOSIS — H903 Sensorineural hearing loss, bilateral: Secondary | ICD-10-CM | POA: Diagnosis not present

## 2017-04-11 DIAGNOSIS — J40 Bronchitis, not specified as acute or chronic: Secondary | ICD-10-CM | POA: Diagnosis not present

## 2017-04-11 DIAGNOSIS — J4530 Mild persistent asthma, uncomplicated: Secondary | ICD-10-CM | POA: Diagnosis not present

## 2017-04-11 DIAGNOSIS — Z125 Encounter for screening for malignant neoplasm of prostate: Secondary | ICD-10-CM | POA: Diagnosis not present

## 2017-04-11 DIAGNOSIS — N401 Enlarged prostate with lower urinary tract symptoms: Secondary | ICD-10-CM | POA: Diagnosis not present

## 2017-04-11 DIAGNOSIS — I872 Venous insufficiency (chronic) (peripheral): Secondary | ICD-10-CM | POA: Diagnosis not present

## 2017-04-11 DIAGNOSIS — E119 Type 2 diabetes mellitus without complications: Secondary | ICD-10-CM | POA: Diagnosis not present

## 2017-04-18 DIAGNOSIS — R972 Elevated prostate specific antigen [PSA]: Secondary | ICD-10-CM | POA: Diagnosis not present

## 2017-05-07 DIAGNOSIS — Z9621 Cochlear implant status: Secondary | ICD-10-CM | POA: Diagnosis not present

## 2017-05-07 DIAGNOSIS — E119 Type 2 diabetes mellitus without complications: Secondary | ICD-10-CM | POA: Diagnosis not present

## 2017-05-07 DIAGNOSIS — R41841 Cognitive communication deficit: Secondary | ICD-10-CM | POA: Diagnosis not present

## 2017-05-07 DIAGNOSIS — Z7984 Long term (current) use of oral hypoglycemic drugs: Secondary | ICD-10-CM | POA: Diagnosis not present

## 2017-05-07 DIAGNOSIS — R1312 Dysphagia, oropharyngeal phase: Secondary | ICD-10-CM | POA: Diagnosis not present

## 2017-05-07 DIAGNOSIS — Z9181 History of falling: Secondary | ICD-10-CM | POA: Diagnosis not present

## 2017-05-07 DIAGNOSIS — G231 Progressive supranuclear ophthalmoplegia [Steele-Richardson-Olszewski]: Secondary | ICD-10-CM | POA: Diagnosis not present

## 2017-05-10 DIAGNOSIS — R41841 Cognitive communication deficit: Secondary | ICD-10-CM | POA: Diagnosis not present

## 2017-05-10 DIAGNOSIS — G231 Progressive supranuclear ophthalmoplegia [Steele-Richardson-Olszewski]: Secondary | ICD-10-CM | POA: Diagnosis not present

## 2017-05-10 DIAGNOSIS — Z7984 Long term (current) use of oral hypoglycemic drugs: Secondary | ICD-10-CM | POA: Diagnosis not present

## 2017-05-10 DIAGNOSIS — E119 Type 2 diabetes mellitus without complications: Secondary | ICD-10-CM | POA: Diagnosis not present

## 2017-05-10 DIAGNOSIS — R1312 Dysphagia, oropharyngeal phase: Secondary | ICD-10-CM | POA: Diagnosis not present

## 2017-05-10 DIAGNOSIS — Z9181 History of falling: Secondary | ICD-10-CM | POA: Diagnosis not present

## 2017-05-11 DIAGNOSIS — R41841 Cognitive communication deficit: Secondary | ICD-10-CM | POA: Diagnosis not present

## 2017-05-11 DIAGNOSIS — G231 Progressive supranuclear ophthalmoplegia [Steele-Richardson-Olszewski]: Secondary | ICD-10-CM | POA: Diagnosis not present

## 2017-05-11 DIAGNOSIS — R1312 Dysphagia, oropharyngeal phase: Secondary | ICD-10-CM | POA: Diagnosis not present

## 2017-05-11 DIAGNOSIS — Z7984 Long term (current) use of oral hypoglycemic drugs: Secondary | ICD-10-CM | POA: Diagnosis not present

## 2017-05-11 DIAGNOSIS — Z9181 History of falling: Secondary | ICD-10-CM | POA: Diagnosis not present

## 2017-05-11 DIAGNOSIS — E119 Type 2 diabetes mellitus without complications: Secondary | ICD-10-CM | POA: Diagnosis not present

## 2017-05-14 DIAGNOSIS — E119 Type 2 diabetes mellitus without complications: Secondary | ICD-10-CM | POA: Diagnosis not present

## 2017-05-14 DIAGNOSIS — R41841 Cognitive communication deficit: Secondary | ICD-10-CM | POA: Diagnosis not present

## 2017-05-14 DIAGNOSIS — R1312 Dysphagia, oropharyngeal phase: Secondary | ICD-10-CM | POA: Diagnosis not present

## 2017-05-14 DIAGNOSIS — Z7984 Long term (current) use of oral hypoglycemic drugs: Secondary | ICD-10-CM | POA: Diagnosis not present

## 2017-05-14 DIAGNOSIS — G231 Progressive supranuclear ophthalmoplegia [Steele-Richardson-Olszewski]: Secondary | ICD-10-CM | POA: Diagnosis not present

## 2017-05-14 DIAGNOSIS — Z9181 History of falling: Secondary | ICD-10-CM | POA: Diagnosis not present

## 2017-05-15 DIAGNOSIS — Z9181 History of falling: Secondary | ICD-10-CM | POA: Diagnosis not present

## 2017-05-15 DIAGNOSIS — Z7984 Long term (current) use of oral hypoglycemic drugs: Secondary | ICD-10-CM | POA: Diagnosis not present

## 2017-05-15 DIAGNOSIS — R41841 Cognitive communication deficit: Secondary | ICD-10-CM | POA: Diagnosis not present

## 2017-05-15 DIAGNOSIS — R1312 Dysphagia, oropharyngeal phase: Secondary | ICD-10-CM | POA: Diagnosis not present

## 2017-05-15 DIAGNOSIS — E119 Type 2 diabetes mellitus without complications: Secondary | ICD-10-CM | POA: Diagnosis not present

## 2017-05-15 DIAGNOSIS — G231 Progressive supranuclear ophthalmoplegia [Steele-Richardson-Olszewski]: Secondary | ICD-10-CM | POA: Diagnosis not present

## 2017-05-17 DIAGNOSIS — Z7984 Long term (current) use of oral hypoglycemic drugs: Secondary | ICD-10-CM | POA: Diagnosis not present

## 2017-05-17 DIAGNOSIS — R41841 Cognitive communication deficit: Secondary | ICD-10-CM | POA: Diagnosis not present

## 2017-05-17 DIAGNOSIS — E119 Type 2 diabetes mellitus without complications: Secondary | ICD-10-CM | POA: Diagnosis not present

## 2017-05-17 DIAGNOSIS — G231 Progressive supranuclear ophthalmoplegia [Steele-Richardson-Olszewski]: Secondary | ICD-10-CM | POA: Diagnosis not present

## 2017-05-17 DIAGNOSIS — R1312 Dysphagia, oropharyngeal phase: Secondary | ICD-10-CM | POA: Diagnosis not present

## 2017-05-17 DIAGNOSIS — Z9181 History of falling: Secondary | ICD-10-CM | POA: Diagnosis not present

## 2017-05-21 DIAGNOSIS — Z9181 History of falling: Secondary | ICD-10-CM | POA: Diagnosis not present

## 2017-05-21 DIAGNOSIS — R41841 Cognitive communication deficit: Secondary | ICD-10-CM | POA: Diagnosis not present

## 2017-05-21 DIAGNOSIS — Z7984 Long term (current) use of oral hypoglycemic drugs: Secondary | ICD-10-CM | POA: Diagnosis not present

## 2017-05-21 DIAGNOSIS — G231 Progressive supranuclear ophthalmoplegia [Steele-Richardson-Olszewski]: Secondary | ICD-10-CM | POA: Diagnosis not present

## 2017-05-21 DIAGNOSIS — R1312 Dysphagia, oropharyngeal phase: Secondary | ICD-10-CM | POA: Diagnosis not present

## 2017-05-21 DIAGNOSIS — E119 Type 2 diabetes mellitus without complications: Secondary | ICD-10-CM | POA: Diagnosis not present

## 2017-05-22 ENCOUNTER — Ambulatory Visit: Payer: Medicare Other | Admitting: Urology

## 2017-05-23 DIAGNOSIS — G231 Progressive supranuclear ophthalmoplegia [Steele-Richardson-Olszewski]: Secondary | ICD-10-CM | POA: Diagnosis not present

## 2017-05-23 DIAGNOSIS — Z79899 Other long term (current) drug therapy: Secondary | ICD-10-CM | POA: Diagnosis not present

## 2017-05-23 DIAGNOSIS — H5789 Other specified disorders of eye and adnexa: Secondary | ICD-10-CM | POA: Diagnosis not present

## 2017-05-23 DIAGNOSIS — K117 Disturbances of salivary secretion: Secondary | ICD-10-CM | POA: Diagnosis not present

## 2017-05-23 DIAGNOSIS — Z993 Dependence on wheelchair: Secondary | ICD-10-CM | POA: Diagnosis not present

## 2017-05-25 DIAGNOSIS — S0101XA Laceration without foreign body of scalp, initial encounter: Secondary | ICD-10-CM | POA: Diagnosis not present

## 2017-05-25 DIAGNOSIS — G20 Parkinson's disease: Secondary | ICD-10-CM | POA: Diagnosis not present

## 2017-05-25 DIAGNOSIS — G231 Progressive supranuclear ophthalmoplegia [Steele-Richardson-Olszewski]: Secondary | ICD-10-CM | POA: Diagnosis not present

## 2017-05-25 DIAGNOSIS — Z79899 Other long term (current) drug therapy: Secondary | ICD-10-CM | POA: Diagnosis not present

## 2017-05-25 DIAGNOSIS — Z7984 Long term (current) use of oral hypoglycemic drugs: Secondary | ICD-10-CM | POA: Diagnosis not present

## 2017-05-25 DIAGNOSIS — R41841 Cognitive communication deficit: Secondary | ICD-10-CM | POA: Diagnosis not present

## 2017-05-25 DIAGNOSIS — Z9181 History of falling: Secondary | ICD-10-CM | POA: Diagnosis not present

## 2017-05-25 DIAGNOSIS — R1312 Dysphagia, oropharyngeal phase: Secondary | ICD-10-CM | POA: Diagnosis not present

## 2017-05-25 DIAGNOSIS — E119 Type 2 diabetes mellitus without complications: Secondary | ICD-10-CM | POA: Diagnosis not present

## 2017-05-29 DIAGNOSIS — R1312 Dysphagia, oropharyngeal phase: Secondary | ICD-10-CM | POA: Diagnosis not present

## 2017-05-29 DIAGNOSIS — Z9181 History of falling: Secondary | ICD-10-CM | POA: Diagnosis not present

## 2017-05-29 DIAGNOSIS — G231 Progressive supranuclear ophthalmoplegia [Steele-Richardson-Olszewski]: Secondary | ICD-10-CM | POA: Diagnosis not present

## 2017-05-29 DIAGNOSIS — R41841 Cognitive communication deficit: Secondary | ICD-10-CM | POA: Diagnosis not present

## 2017-05-29 DIAGNOSIS — E119 Type 2 diabetes mellitus without complications: Secondary | ICD-10-CM | POA: Diagnosis not present

## 2017-05-29 DIAGNOSIS — Z7984 Long term (current) use of oral hypoglycemic drugs: Secondary | ICD-10-CM | POA: Diagnosis not present

## 2017-06-06 DIAGNOSIS — E119 Type 2 diabetes mellitus without complications: Secondary | ICD-10-CM | POA: Diagnosis not present

## 2017-06-06 DIAGNOSIS — R1312 Dysphagia, oropharyngeal phase: Secondary | ICD-10-CM | POA: Diagnosis not present

## 2017-06-06 DIAGNOSIS — Z7984 Long term (current) use of oral hypoglycemic drugs: Secondary | ICD-10-CM | POA: Diagnosis not present

## 2017-06-06 DIAGNOSIS — G231 Progressive supranuclear ophthalmoplegia [Steele-Richardson-Olszewski]: Secondary | ICD-10-CM | POA: Diagnosis not present

## 2017-06-06 DIAGNOSIS — R41841 Cognitive communication deficit: Secondary | ICD-10-CM | POA: Diagnosis not present

## 2017-06-06 DIAGNOSIS — Z9181 History of falling: Secondary | ICD-10-CM | POA: Diagnosis not present

## 2017-06-08 DIAGNOSIS — Z9181 History of falling: Secondary | ICD-10-CM | POA: Diagnosis not present

## 2017-06-08 DIAGNOSIS — E119 Type 2 diabetes mellitus without complications: Secondary | ICD-10-CM | POA: Diagnosis not present

## 2017-06-08 DIAGNOSIS — G231 Progressive supranuclear ophthalmoplegia [Steele-Richardson-Olszewski]: Secondary | ICD-10-CM | POA: Diagnosis not present

## 2017-06-08 DIAGNOSIS — R1312 Dysphagia, oropharyngeal phase: Secondary | ICD-10-CM | POA: Diagnosis not present

## 2017-06-08 DIAGNOSIS — Z7984 Long term (current) use of oral hypoglycemic drugs: Secondary | ICD-10-CM | POA: Diagnosis not present

## 2017-06-08 DIAGNOSIS — R41841 Cognitive communication deficit: Secondary | ICD-10-CM | POA: Diagnosis not present

## 2017-06-11 DIAGNOSIS — R41841 Cognitive communication deficit: Secondary | ICD-10-CM | POA: Diagnosis not present

## 2017-06-11 DIAGNOSIS — Z9181 History of falling: Secondary | ICD-10-CM | POA: Diagnosis not present

## 2017-06-11 DIAGNOSIS — R1312 Dysphagia, oropharyngeal phase: Secondary | ICD-10-CM | POA: Diagnosis not present

## 2017-06-11 DIAGNOSIS — G231 Progressive supranuclear ophthalmoplegia [Steele-Richardson-Olszewski]: Secondary | ICD-10-CM | POA: Diagnosis not present

## 2017-06-11 DIAGNOSIS — E119 Type 2 diabetes mellitus without complications: Secondary | ICD-10-CM | POA: Diagnosis not present

## 2017-06-11 DIAGNOSIS — Z7984 Long term (current) use of oral hypoglycemic drugs: Secondary | ICD-10-CM | POA: Diagnosis not present

## 2017-06-12 DIAGNOSIS — E119 Type 2 diabetes mellitus without complications: Secondary | ICD-10-CM | POA: Diagnosis not present

## 2017-06-12 DIAGNOSIS — R41841 Cognitive communication deficit: Secondary | ICD-10-CM | POA: Diagnosis not present

## 2017-06-12 DIAGNOSIS — R1312 Dysphagia, oropharyngeal phase: Secondary | ICD-10-CM | POA: Diagnosis not present

## 2017-06-12 DIAGNOSIS — Z7984 Long term (current) use of oral hypoglycemic drugs: Secondary | ICD-10-CM | POA: Diagnosis not present

## 2017-06-12 DIAGNOSIS — G231 Progressive supranuclear ophthalmoplegia [Steele-Richardson-Olszewski]: Secondary | ICD-10-CM | POA: Diagnosis not present

## 2017-06-12 DIAGNOSIS — Z9181 History of falling: Secondary | ICD-10-CM | POA: Diagnosis not present

## 2017-06-13 DIAGNOSIS — G231 Progressive supranuclear ophthalmoplegia [Steele-Richardson-Olszewski]: Secondary | ICD-10-CM | POA: Diagnosis not present

## 2017-06-13 DIAGNOSIS — R41841 Cognitive communication deficit: Secondary | ICD-10-CM | POA: Diagnosis not present

## 2017-06-13 DIAGNOSIS — E119 Type 2 diabetes mellitus without complications: Secondary | ICD-10-CM | POA: Diagnosis not present

## 2017-06-13 DIAGNOSIS — H903 Sensorineural hearing loss, bilateral: Secondary | ICD-10-CM | POA: Diagnosis not present

## 2017-06-13 DIAGNOSIS — R1312 Dysphagia, oropharyngeal phase: Secondary | ICD-10-CM | POA: Diagnosis not present

## 2017-06-13 DIAGNOSIS — Z9181 History of falling: Secondary | ICD-10-CM | POA: Diagnosis not present

## 2017-06-13 DIAGNOSIS — Z7984 Long term (current) use of oral hypoglycemic drugs: Secondary | ICD-10-CM | POA: Diagnosis not present

## 2017-06-14 DIAGNOSIS — Z7984 Long term (current) use of oral hypoglycemic drugs: Secondary | ICD-10-CM | POA: Diagnosis not present

## 2017-06-14 DIAGNOSIS — E119 Type 2 diabetes mellitus without complications: Secondary | ICD-10-CM | POA: Diagnosis not present

## 2017-06-14 DIAGNOSIS — R1312 Dysphagia, oropharyngeal phase: Secondary | ICD-10-CM | POA: Diagnosis not present

## 2017-06-14 DIAGNOSIS — G231 Progressive supranuclear ophthalmoplegia [Steele-Richardson-Olszewski]: Secondary | ICD-10-CM | POA: Diagnosis not present

## 2017-06-14 DIAGNOSIS — Z9181 History of falling: Secondary | ICD-10-CM | POA: Diagnosis not present

## 2017-06-14 DIAGNOSIS — R41841 Cognitive communication deficit: Secondary | ICD-10-CM | POA: Diagnosis not present

## 2017-06-15 DIAGNOSIS — G231 Progressive supranuclear ophthalmoplegia [Steele-Richardson-Olszewski]: Secondary | ICD-10-CM | POA: Diagnosis not present

## 2017-06-15 DIAGNOSIS — Z9181 History of falling: Secondary | ICD-10-CM | POA: Diagnosis not present

## 2017-06-15 DIAGNOSIS — R1312 Dysphagia, oropharyngeal phase: Secondary | ICD-10-CM | POA: Diagnosis not present

## 2017-06-15 DIAGNOSIS — Z7984 Long term (current) use of oral hypoglycemic drugs: Secondary | ICD-10-CM | POA: Diagnosis not present

## 2017-06-15 DIAGNOSIS — R41841 Cognitive communication deficit: Secondary | ICD-10-CM | POA: Diagnosis not present

## 2017-06-15 DIAGNOSIS — E119 Type 2 diabetes mellitus without complications: Secondary | ICD-10-CM | POA: Diagnosis not present

## 2017-06-18 DIAGNOSIS — R1312 Dysphagia, oropharyngeal phase: Secondary | ICD-10-CM | POA: Diagnosis not present

## 2017-06-18 DIAGNOSIS — G231 Progressive supranuclear ophthalmoplegia [Steele-Richardson-Olszewski]: Secondary | ICD-10-CM | POA: Diagnosis not present

## 2017-06-18 DIAGNOSIS — Z7984 Long term (current) use of oral hypoglycemic drugs: Secondary | ICD-10-CM | POA: Diagnosis not present

## 2017-06-18 DIAGNOSIS — R41841 Cognitive communication deficit: Secondary | ICD-10-CM | POA: Diagnosis not present

## 2017-06-18 DIAGNOSIS — Z9181 History of falling: Secondary | ICD-10-CM | POA: Diagnosis not present

## 2017-06-18 DIAGNOSIS — E119 Type 2 diabetes mellitus without complications: Secondary | ICD-10-CM | POA: Diagnosis not present

## 2017-06-19 DIAGNOSIS — E119 Type 2 diabetes mellitus without complications: Secondary | ICD-10-CM | POA: Diagnosis not present

## 2017-06-19 DIAGNOSIS — Z9181 History of falling: Secondary | ICD-10-CM | POA: Diagnosis not present

## 2017-06-19 DIAGNOSIS — R41841 Cognitive communication deficit: Secondary | ICD-10-CM | POA: Diagnosis not present

## 2017-06-19 DIAGNOSIS — Z7984 Long term (current) use of oral hypoglycemic drugs: Secondary | ICD-10-CM | POA: Diagnosis not present

## 2017-06-19 DIAGNOSIS — R1312 Dysphagia, oropharyngeal phase: Secondary | ICD-10-CM | POA: Diagnosis not present

## 2017-06-19 DIAGNOSIS — G231 Progressive supranuclear ophthalmoplegia [Steele-Richardson-Olszewski]: Secondary | ICD-10-CM | POA: Diagnosis not present

## 2017-06-21 DIAGNOSIS — Z9181 History of falling: Secondary | ICD-10-CM | POA: Diagnosis not present

## 2017-06-21 DIAGNOSIS — Z7984 Long term (current) use of oral hypoglycemic drugs: Secondary | ICD-10-CM | POA: Diagnosis not present

## 2017-06-21 DIAGNOSIS — R41841 Cognitive communication deficit: Secondary | ICD-10-CM | POA: Diagnosis not present

## 2017-06-21 DIAGNOSIS — E119 Type 2 diabetes mellitus without complications: Secondary | ICD-10-CM | POA: Diagnosis not present

## 2017-06-21 DIAGNOSIS — R1312 Dysphagia, oropharyngeal phase: Secondary | ICD-10-CM | POA: Diagnosis not present

## 2017-06-21 DIAGNOSIS — G231 Progressive supranuclear ophthalmoplegia [Steele-Richardson-Olszewski]: Secondary | ICD-10-CM | POA: Diagnosis not present

## 2017-06-28 DIAGNOSIS — R1312 Dysphagia, oropharyngeal phase: Secondary | ICD-10-CM | POA: Diagnosis not present

## 2017-06-28 DIAGNOSIS — R41841 Cognitive communication deficit: Secondary | ICD-10-CM | POA: Diagnosis not present

## 2017-06-28 DIAGNOSIS — E119 Type 2 diabetes mellitus without complications: Secondary | ICD-10-CM | POA: Diagnosis not present

## 2017-06-28 DIAGNOSIS — Z7984 Long term (current) use of oral hypoglycemic drugs: Secondary | ICD-10-CM | POA: Diagnosis not present

## 2017-06-28 DIAGNOSIS — G231 Progressive supranuclear ophthalmoplegia [Steele-Richardson-Olszewski]: Secondary | ICD-10-CM | POA: Diagnosis not present

## 2017-06-28 DIAGNOSIS — Z9181 History of falling: Secondary | ICD-10-CM | POA: Diagnosis not present

## 2017-07-02 DIAGNOSIS — Z9181 History of falling: Secondary | ICD-10-CM | POA: Diagnosis not present

## 2017-07-02 DIAGNOSIS — G231 Progressive supranuclear ophthalmoplegia [Steele-Richardson-Olszewski]: Secondary | ICD-10-CM | POA: Diagnosis not present

## 2017-07-02 DIAGNOSIS — E119 Type 2 diabetes mellitus without complications: Secondary | ICD-10-CM | POA: Diagnosis not present

## 2017-07-02 DIAGNOSIS — R41841 Cognitive communication deficit: Secondary | ICD-10-CM | POA: Diagnosis not present

## 2017-07-02 DIAGNOSIS — Z7984 Long term (current) use of oral hypoglycemic drugs: Secondary | ICD-10-CM | POA: Diagnosis not present

## 2017-07-02 DIAGNOSIS — R1312 Dysphagia, oropharyngeal phase: Secondary | ICD-10-CM | POA: Diagnosis not present

## 2017-07-04 DIAGNOSIS — G231 Progressive supranuclear ophthalmoplegia [Steele-Richardson-Olszewski]: Secondary | ICD-10-CM | POA: Diagnosis not present

## 2017-07-04 DIAGNOSIS — Z9181 History of falling: Secondary | ICD-10-CM | POA: Diagnosis not present

## 2017-07-04 DIAGNOSIS — R1312 Dysphagia, oropharyngeal phase: Secondary | ICD-10-CM | POA: Diagnosis not present

## 2017-07-04 DIAGNOSIS — E119 Type 2 diabetes mellitus without complications: Secondary | ICD-10-CM | POA: Diagnosis not present

## 2017-07-04 DIAGNOSIS — R41841 Cognitive communication deficit: Secondary | ICD-10-CM | POA: Diagnosis not present

## 2017-07-04 DIAGNOSIS — Z7984 Long term (current) use of oral hypoglycemic drugs: Secondary | ICD-10-CM | POA: Diagnosis not present

## 2017-07-13 DIAGNOSIS — H527 Unspecified disorder of refraction: Secondary | ICD-10-CM | POA: Diagnosis not present

## 2017-07-13 DIAGNOSIS — H0102A Squamous blepharitis right eye, upper and lower eyelids: Secondary | ICD-10-CM | POA: Diagnosis not present

## 2017-07-13 DIAGNOSIS — H16213 Exposure keratoconjunctivitis, bilateral: Secondary | ICD-10-CM | POA: Diagnosis not present

## 2017-07-13 DIAGNOSIS — H2513 Age-related nuclear cataract, bilateral: Secondary | ICD-10-CM | POA: Diagnosis not present

## 2017-07-13 DIAGNOSIS — G231 Progressive supranuclear ophthalmoplegia [Steele-Richardson-Olszewski]: Secondary | ICD-10-CM | POA: Diagnosis not present

## 2017-07-13 DIAGNOSIS — H524 Presbyopia: Secondary | ICD-10-CM | POA: Diagnosis not present

## 2017-07-13 DIAGNOSIS — H0102B Squamous blepharitis left eye, upper and lower eyelids: Secondary | ICD-10-CM | POA: Diagnosis not present

## 2017-07-23 DIAGNOSIS — H2513 Age-related nuclear cataract, bilateral: Secondary | ICD-10-CM | POA: Diagnosis not present

## 2017-07-23 DIAGNOSIS — H40033 Anatomical narrow angle, bilateral: Secondary | ICD-10-CM | POA: Diagnosis not present

## 2017-08-09 DIAGNOSIS — I872 Venous insufficiency (chronic) (peripheral): Secondary | ICD-10-CM | POA: Diagnosis not present

## 2017-08-09 DIAGNOSIS — E119 Type 2 diabetes mellitus without complications: Secondary | ICD-10-CM | POA: Diagnosis not present

## 2017-08-09 DIAGNOSIS — N401 Enlarged prostate with lower urinary tract symptoms: Secondary | ICD-10-CM | POA: Diagnosis not present

## 2017-08-09 DIAGNOSIS — R5383 Other fatigue: Secondary | ICD-10-CM | POA: Diagnosis not present

## 2017-08-09 DIAGNOSIS — J40 Bronchitis, not specified as acute or chronic: Secondary | ICD-10-CM | POA: Diagnosis not present

## 2017-08-09 DIAGNOSIS — J4530 Mild persistent asthma, uncomplicated: Secondary | ICD-10-CM | POA: Diagnosis not present

## 2017-08-16 DIAGNOSIS — F0390 Unspecified dementia without behavioral disturbance: Secondary | ICD-10-CM | POA: Diagnosis not present

## 2017-08-16 DIAGNOSIS — J9811 Atelectasis: Secondary | ICD-10-CM | POA: Diagnosis not present

## 2017-08-16 DIAGNOSIS — Z7984 Long term (current) use of oral hypoglycemic drugs: Secondary | ICD-10-CM | POA: Diagnosis not present

## 2017-08-16 DIAGNOSIS — W19XXXA Unspecified fall, initial encounter: Secondary | ICD-10-CM | POA: Diagnosis not present

## 2017-08-16 DIAGNOSIS — R221 Localized swelling, mass and lump, neck: Secondary | ICD-10-CM | POA: Diagnosis not present

## 2017-08-16 DIAGNOSIS — E119 Type 2 diabetes mellitus without complications: Secondary | ICD-10-CM | POA: Diagnosis not present

## 2017-08-16 DIAGNOSIS — R55 Syncope and collapse: Secondary | ICD-10-CM | POA: Diagnosis not present

## 2017-08-16 DIAGNOSIS — S51011A Laceration without foreign body of right elbow, initial encounter: Secondary | ICD-10-CM | POA: Diagnosis not present

## 2017-08-16 DIAGNOSIS — R220 Localized swelling, mass and lump, head: Secondary | ICD-10-CM | POA: Diagnosis not present

## 2017-09-05 DIAGNOSIS — K117 Disturbances of salivary secretion: Secondary | ICD-10-CM | POA: Diagnosis not present

## 2017-09-05 DIAGNOSIS — G20 Parkinson's disease: Secondary | ICD-10-CM | POA: Diagnosis not present

## 2017-09-17 DIAGNOSIS — H903 Sensorineural hearing loss, bilateral: Secondary | ICD-10-CM | POA: Diagnosis not present

## 2017-09-24 DIAGNOSIS — H903 Sensorineural hearing loss, bilateral: Secondary | ICD-10-CM | POA: Diagnosis not present

## 2017-09-25 DIAGNOSIS — N401 Enlarged prostate with lower urinary tract symptoms: Secondary | ICD-10-CM | POA: Diagnosis not present

## 2017-09-25 DIAGNOSIS — E119 Type 2 diabetes mellitus without complications: Secondary | ICD-10-CM | POA: Diagnosis not present

## 2017-09-29 DIAGNOSIS — S59902A Unspecified injury of left elbow, initial encounter: Secondary | ICD-10-CM | POA: Diagnosis not present

## 2017-09-29 DIAGNOSIS — R079 Chest pain, unspecified: Secondary | ICD-10-CM | POA: Diagnosis not present

## 2017-09-29 DIAGNOSIS — E119 Type 2 diabetes mellitus without complications: Secondary | ICD-10-CM | POA: Diagnosis not present

## 2017-09-29 DIAGNOSIS — M79602 Pain in left arm: Secondary | ICD-10-CM | POA: Diagnosis not present

## 2017-09-29 DIAGNOSIS — S42035A Nondisplaced fracture of lateral end of left clavicle, initial encounter for closed fracture: Secondary | ICD-10-CM | POA: Diagnosis not present

## 2017-09-29 DIAGNOSIS — S42255A Nondisplaced fracture of greater tuberosity of left humerus, initial encounter for closed fracture: Secondary | ICD-10-CM | POA: Diagnosis not present

## 2017-09-29 DIAGNOSIS — Z7984 Long term (current) use of oral hypoglycemic drugs: Secondary | ICD-10-CM | POA: Diagnosis not present

## 2017-09-29 DIAGNOSIS — W1839XA Other fall on same level, initial encounter: Secondary | ICD-10-CM | POA: Diagnosis not present

## 2017-09-29 DIAGNOSIS — S42032A Displaced fracture of lateral end of left clavicle, initial encounter for closed fracture: Secondary | ICD-10-CM | POA: Diagnosis not present

## 2017-10-05 DIAGNOSIS — G20 Parkinson's disease: Secondary | ICD-10-CM | POA: Diagnosis not present

## 2017-10-05 DIAGNOSIS — S42302S Unspecified fracture of shaft of humerus, left arm, sequela: Secondary | ICD-10-CM | POA: Diagnosis not present

## 2017-10-05 DIAGNOSIS — Z9181 History of falling: Secondary | ICD-10-CM | POA: Diagnosis not present

## 2017-10-05 DIAGNOSIS — E119 Type 2 diabetes mellitus without complications: Secondary | ICD-10-CM | POA: Diagnosis not present

## 2017-10-05 DIAGNOSIS — R1310 Dysphagia, unspecified: Secondary | ICD-10-CM | POA: Diagnosis not present

## 2017-10-05 DIAGNOSIS — R634 Abnormal weight loss: Secondary | ICD-10-CM | POA: Diagnosis not present

## 2017-10-05 DIAGNOSIS — N401 Enlarged prostate with lower urinary tract symptoms: Secondary | ICD-10-CM | POA: Diagnosis not present

## 2017-10-10 DIAGNOSIS — S42212A Unspecified displaced fracture of surgical neck of left humerus, initial encounter for closed fracture: Secondary | ICD-10-CM | POA: Diagnosis not present

## 2017-10-17 DIAGNOSIS — E119 Type 2 diabetes mellitus without complications: Secondary | ICD-10-CM | POA: Diagnosis not present

## 2017-10-17 DIAGNOSIS — R634 Abnormal weight loss: Secondary | ICD-10-CM | POA: Diagnosis not present

## 2017-10-17 DIAGNOSIS — Z9181 History of falling: Secondary | ICD-10-CM | POA: Diagnosis not present

## 2017-10-17 DIAGNOSIS — G20 Parkinson's disease: Secondary | ICD-10-CM | POA: Diagnosis not present

## 2017-10-17 DIAGNOSIS — R1310 Dysphagia, unspecified: Secondary | ICD-10-CM | POA: Diagnosis not present

## 2017-10-17 DIAGNOSIS — S42302S Unspecified fracture of shaft of humerus, left arm, sequela: Secondary | ICD-10-CM | POA: Diagnosis not present

## 2017-10-22 DIAGNOSIS — G20 Parkinson's disease: Secondary | ICD-10-CM | POA: Diagnosis not present

## 2017-10-22 DIAGNOSIS — E119 Type 2 diabetes mellitus without complications: Secondary | ICD-10-CM | POA: Diagnosis not present

## 2017-10-22 DIAGNOSIS — R634 Abnormal weight loss: Secondary | ICD-10-CM | POA: Diagnosis not present

## 2017-10-22 DIAGNOSIS — R1310 Dysphagia, unspecified: Secondary | ICD-10-CM | POA: Diagnosis not present

## 2017-10-22 DIAGNOSIS — Z9181 History of falling: Secondary | ICD-10-CM | POA: Diagnosis not present

## 2017-10-22 DIAGNOSIS — S42302S Unspecified fracture of shaft of humerus, left arm, sequela: Secondary | ICD-10-CM | POA: Diagnosis not present

## 2017-10-24 DIAGNOSIS — Z9181 History of falling: Secondary | ICD-10-CM | POA: Diagnosis not present

## 2017-10-24 DIAGNOSIS — R634 Abnormal weight loss: Secondary | ICD-10-CM | POA: Diagnosis not present

## 2017-10-24 DIAGNOSIS — E119 Type 2 diabetes mellitus without complications: Secondary | ICD-10-CM | POA: Diagnosis not present

## 2017-10-24 DIAGNOSIS — G20 Parkinson's disease: Secondary | ICD-10-CM | POA: Diagnosis not present

## 2017-10-24 DIAGNOSIS — S42302S Unspecified fracture of shaft of humerus, left arm, sequela: Secondary | ICD-10-CM | POA: Diagnosis not present

## 2017-10-24 DIAGNOSIS — R1310 Dysphagia, unspecified: Secondary | ICD-10-CM | POA: Diagnosis not present

## 2017-10-25 DIAGNOSIS — R634 Abnormal weight loss: Secondary | ICD-10-CM | POA: Diagnosis not present

## 2017-10-25 DIAGNOSIS — E119 Type 2 diabetes mellitus without complications: Secondary | ICD-10-CM | POA: Diagnosis not present

## 2017-10-25 DIAGNOSIS — S42302S Unspecified fracture of shaft of humerus, left arm, sequela: Secondary | ICD-10-CM | POA: Diagnosis not present

## 2017-10-25 DIAGNOSIS — Z9181 History of falling: Secondary | ICD-10-CM | POA: Diagnosis not present

## 2017-10-25 DIAGNOSIS — G20 Parkinson's disease: Secondary | ICD-10-CM | POA: Diagnosis not present

## 2017-10-25 DIAGNOSIS — R1310 Dysphagia, unspecified: Secondary | ICD-10-CM | POA: Diagnosis not present

## 2017-10-29 DIAGNOSIS — R1310 Dysphagia, unspecified: Secondary | ICD-10-CM | POA: Diagnosis not present

## 2017-10-29 DIAGNOSIS — Z9181 History of falling: Secondary | ICD-10-CM | POA: Diagnosis not present

## 2017-10-29 DIAGNOSIS — S42302S Unspecified fracture of shaft of humerus, left arm, sequela: Secondary | ICD-10-CM | POA: Diagnosis not present

## 2017-10-29 DIAGNOSIS — E119 Type 2 diabetes mellitus without complications: Secondary | ICD-10-CM | POA: Diagnosis not present

## 2017-10-29 DIAGNOSIS — G20 Parkinson's disease: Secondary | ICD-10-CM | POA: Diagnosis not present

## 2017-10-29 DIAGNOSIS — R634 Abnormal weight loss: Secondary | ICD-10-CM | POA: Diagnosis not present

## 2017-10-31 DIAGNOSIS — G20 Parkinson's disease: Secondary | ICD-10-CM | POA: Diagnosis not present

## 2017-10-31 DIAGNOSIS — R1310 Dysphagia, unspecified: Secondary | ICD-10-CM | POA: Diagnosis not present

## 2017-10-31 DIAGNOSIS — Z9181 History of falling: Secondary | ICD-10-CM | POA: Diagnosis not present

## 2017-10-31 DIAGNOSIS — S42302S Unspecified fracture of shaft of humerus, left arm, sequela: Secondary | ICD-10-CM | POA: Diagnosis not present

## 2017-10-31 DIAGNOSIS — R634 Abnormal weight loss: Secondary | ICD-10-CM | POA: Diagnosis not present

## 2017-10-31 DIAGNOSIS — E119 Type 2 diabetes mellitus without complications: Secondary | ICD-10-CM | POA: Diagnosis not present

## 2017-11-02 DIAGNOSIS — G20 Parkinson's disease: Secondary | ICD-10-CM | POA: Diagnosis not present

## 2017-11-02 DIAGNOSIS — R634 Abnormal weight loss: Secondary | ICD-10-CM | POA: Diagnosis not present

## 2017-11-02 DIAGNOSIS — S42302S Unspecified fracture of shaft of humerus, left arm, sequela: Secondary | ICD-10-CM | POA: Diagnosis not present

## 2017-11-02 DIAGNOSIS — E119 Type 2 diabetes mellitus without complications: Secondary | ICD-10-CM | POA: Diagnosis not present

## 2017-11-02 DIAGNOSIS — N401 Enlarged prostate with lower urinary tract symptoms: Secondary | ICD-10-CM | POA: Diagnosis not present

## 2017-11-02 DIAGNOSIS — R1310 Dysphagia, unspecified: Secondary | ICD-10-CM | POA: Diagnosis not present

## 2017-11-02 DIAGNOSIS — Z9181 History of falling: Secondary | ICD-10-CM | POA: Diagnosis not present

## 2017-11-05 DIAGNOSIS — R634 Abnormal weight loss: Secondary | ICD-10-CM | POA: Diagnosis not present

## 2017-11-05 DIAGNOSIS — S42302S Unspecified fracture of shaft of humerus, left arm, sequela: Secondary | ICD-10-CM | POA: Diagnosis not present

## 2017-11-05 DIAGNOSIS — G20 Parkinson's disease: Secondary | ICD-10-CM | POA: Diagnosis not present

## 2017-11-05 DIAGNOSIS — Z9181 History of falling: Secondary | ICD-10-CM | POA: Diagnosis not present

## 2017-11-05 DIAGNOSIS — E119 Type 2 diabetes mellitus without complications: Secondary | ICD-10-CM | POA: Diagnosis not present

## 2017-11-05 DIAGNOSIS — R1310 Dysphagia, unspecified: Secondary | ICD-10-CM | POA: Diagnosis not present

## 2017-11-06 DIAGNOSIS — S42302S Unspecified fracture of shaft of humerus, left arm, sequela: Secondary | ICD-10-CM | POA: Diagnosis not present

## 2017-11-06 DIAGNOSIS — E119 Type 2 diabetes mellitus without complications: Secondary | ICD-10-CM | POA: Diagnosis not present

## 2017-11-06 DIAGNOSIS — Z9181 History of falling: Secondary | ICD-10-CM | POA: Diagnosis not present

## 2017-11-06 DIAGNOSIS — R1310 Dysphagia, unspecified: Secondary | ICD-10-CM | POA: Diagnosis not present

## 2017-11-06 DIAGNOSIS — R634 Abnormal weight loss: Secondary | ICD-10-CM | POA: Diagnosis not present

## 2017-11-06 DIAGNOSIS — G20 Parkinson's disease: Secondary | ICD-10-CM | POA: Diagnosis not present

## 2017-11-07 DIAGNOSIS — R634 Abnormal weight loss: Secondary | ICD-10-CM | POA: Diagnosis not present

## 2017-11-07 DIAGNOSIS — E119 Type 2 diabetes mellitus without complications: Secondary | ICD-10-CM | POA: Diagnosis not present

## 2017-11-07 DIAGNOSIS — Z9181 History of falling: Secondary | ICD-10-CM | POA: Diagnosis not present

## 2017-11-07 DIAGNOSIS — S42302S Unspecified fracture of shaft of humerus, left arm, sequela: Secondary | ICD-10-CM | POA: Diagnosis not present

## 2017-11-07 DIAGNOSIS — G20 Parkinson's disease: Secondary | ICD-10-CM | POA: Diagnosis not present

## 2017-11-07 DIAGNOSIS — R1310 Dysphagia, unspecified: Secondary | ICD-10-CM | POA: Diagnosis not present

## 2017-11-09 DIAGNOSIS — G20 Parkinson's disease: Secondary | ICD-10-CM | POA: Diagnosis not present

## 2017-11-09 DIAGNOSIS — S42302S Unspecified fracture of shaft of humerus, left arm, sequela: Secondary | ICD-10-CM | POA: Diagnosis not present

## 2017-11-09 DIAGNOSIS — R1310 Dysphagia, unspecified: Secondary | ICD-10-CM | POA: Diagnosis not present

## 2017-11-09 DIAGNOSIS — R634 Abnormal weight loss: Secondary | ICD-10-CM | POA: Diagnosis not present

## 2017-11-09 DIAGNOSIS — E119 Type 2 diabetes mellitus without complications: Secondary | ICD-10-CM | POA: Diagnosis not present

## 2017-11-09 DIAGNOSIS — Z9181 History of falling: Secondary | ICD-10-CM | POA: Diagnosis not present

## 2017-11-12 DIAGNOSIS — R1310 Dysphagia, unspecified: Secondary | ICD-10-CM | POA: Diagnosis not present

## 2017-11-12 DIAGNOSIS — Z9181 History of falling: Secondary | ICD-10-CM | POA: Diagnosis not present

## 2017-11-12 DIAGNOSIS — R634 Abnormal weight loss: Secondary | ICD-10-CM | POA: Diagnosis not present

## 2017-11-12 DIAGNOSIS — E119 Type 2 diabetes mellitus without complications: Secondary | ICD-10-CM | POA: Diagnosis not present

## 2017-11-12 DIAGNOSIS — S42302S Unspecified fracture of shaft of humerus, left arm, sequela: Secondary | ICD-10-CM | POA: Diagnosis not present

## 2017-11-12 DIAGNOSIS — G20 Parkinson's disease: Secondary | ICD-10-CM | POA: Diagnosis not present

## 2017-11-14 DIAGNOSIS — R1310 Dysphagia, unspecified: Secondary | ICD-10-CM | POA: Diagnosis not present

## 2017-11-14 DIAGNOSIS — R634 Abnormal weight loss: Secondary | ICD-10-CM | POA: Diagnosis not present

## 2017-11-14 DIAGNOSIS — G20 Parkinson's disease: Secondary | ICD-10-CM | POA: Diagnosis not present

## 2017-11-14 DIAGNOSIS — Z9181 History of falling: Secondary | ICD-10-CM | POA: Diagnosis not present

## 2017-11-14 DIAGNOSIS — E119 Type 2 diabetes mellitus without complications: Secondary | ICD-10-CM | POA: Diagnosis not present

## 2017-11-14 DIAGNOSIS — S42302S Unspecified fracture of shaft of humerus, left arm, sequela: Secondary | ICD-10-CM | POA: Diagnosis not present

## 2017-11-16 DIAGNOSIS — Z9181 History of falling: Secondary | ICD-10-CM | POA: Diagnosis not present

## 2017-11-16 DIAGNOSIS — G20 Parkinson's disease: Secondary | ICD-10-CM | POA: Diagnosis not present

## 2017-11-16 DIAGNOSIS — R634 Abnormal weight loss: Secondary | ICD-10-CM | POA: Diagnosis not present

## 2017-11-16 DIAGNOSIS — R1310 Dysphagia, unspecified: Secondary | ICD-10-CM | POA: Diagnosis not present

## 2017-11-16 DIAGNOSIS — E119 Type 2 diabetes mellitus without complications: Secondary | ICD-10-CM | POA: Diagnosis not present

## 2017-11-16 DIAGNOSIS — S42302S Unspecified fracture of shaft of humerus, left arm, sequela: Secondary | ICD-10-CM | POA: Diagnosis not present

## 2017-11-23 DIAGNOSIS — R634 Abnormal weight loss: Secondary | ICD-10-CM | POA: Diagnosis not present

## 2017-11-23 DIAGNOSIS — G20 Parkinson's disease: Secondary | ICD-10-CM | POA: Diagnosis not present

## 2017-11-23 DIAGNOSIS — E119 Type 2 diabetes mellitus without complications: Secondary | ICD-10-CM | POA: Diagnosis not present

## 2017-11-23 DIAGNOSIS — Z9181 History of falling: Secondary | ICD-10-CM | POA: Diagnosis not present

## 2017-11-23 DIAGNOSIS — S42302S Unspecified fracture of shaft of humerus, left arm, sequela: Secondary | ICD-10-CM | POA: Diagnosis not present

## 2017-11-23 DIAGNOSIS — R1310 Dysphagia, unspecified: Secondary | ICD-10-CM | POA: Diagnosis not present

## 2017-11-26 DIAGNOSIS — R1310 Dysphagia, unspecified: Secondary | ICD-10-CM | POA: Diagnosis not present

## 2017-11-26 DIAGNOSIS — R634 Abnormal weight loss: Secondary | ICD-10-CM | POA: Diagnosis not present

## 2017-11-26 DIAGNOSIS — S42302S Unspecified fracture of shaft of humerus, left arm, sequela: Secondary | ICD-10-CM | POA: Diagnosis not present

## 2017-11-26 DIAGNOSIS — E119 Type 2 diabetes mellitus without complications: Secondary | ICD-10-CM | POA: Diagnosis not present

## 2017-11-26 DIAGNOSIS — Z9181 History of falling: Secondary | ICD-10-CM | POA: Diagnosis not present

## 2017-11-26 DIAGNOSIS — G20 Parkinson's disease: Secondary | ICD-10-CM | POA: Diagnosis not present

## 2017-11-28 DIAGNOSIS — S42302S Unspecified fracture of shaft of humerus, left arm, sequela: Secondary | ICD-10-CM | POA: Diagnosis not present

## 2017-11-28 DIAGNOSIS — R634 Abnormal weight loss: Secondary | ICD-10-CM | POA: Diagnosis not present

## 2017-11-28 DIAGNOSIS — E119 Type 2 diabetes mellitus without complications: Secondary | ICD-10-CM | POA: Diagnosis not present

## 2017-11-28 DIAGNOSIS — Z9181 History of falling: Secondary | ICD-10-CM | POA: Diagnosis not present

## 2017-11-28 DIAGNOSIS — R1310 Dysphagia, unspecified: Secondary | ICD-10-CM | POA: Diagnosis not present

## 2017-11-28 DIAGNOSIS — G20 Parkinson's disease: Secondary | ICD-10-CM | POA: Diagnosis not present

## 2017-11-30 DIAGNOSIS — G20 Parkinson's disease: Secondary | ICD-10-CM | POA: Diagnosis not present

## 2017-11-30 DIAGNOSIS — Z9181 History of falling: Secondary | ICD-10-CM | POA: Diagnosis not present

## 2017-11-30 DIAGNOSIS — R634 Abnormal weight loss: Secondary | ICD-10-CM | POA: Diagnosis not present

## 2017-11-30 DIAGNOSIS — S42302S Unspecified fracture of shaft of humerus, left arm, sequela: Secondary | ICD-10-CM | POA: Diagnosis not present

## 2017-11-30 DIAGNOSIS — R1310 Dysphagia, unspecified: Secondary | ICD-10-CM | POA: Diagnosis not present

## 2017-11-30 DIAGNOSIS — E119 Type 2 diabetes mellitus without complications: Secondary | ICD-10-CM | POA: Diagnosis not present

## 2017-12-02 DIAGNOSIS — R1310 Dysphagia, unspecified: Secondary | ICD-10-CM | POA: Diagnosis not present

## 2017-12-02 DIAGNOSIS — E119 Type 2 diabetes mellitus without complications: Secondary | ICD-10-CM | POA: Diagnosis not present

## 2017-12-02 DIAGNOSIS — S42302S Unspecified fracture of shaft of humerus, left arm, sequela: Secondary | ICD-10-CM | POA: Diagnosis not present

## 2017-12-02 DIAGNOSIS — G20 Parkinson's disease: Secondary | ICD-10-CM | POA: Diagnosis not present

## 2017-12-02 DIAGNOSIS — N401 Enlarged prostate with lower urinary tract symptoms: Secondary | ICD-10-CM | POA: Diagnosis not present

## 2017-12-02 DIAGNOSIS — Z9181 History of falling: Secondary | ICD-10-CM | POA: Diagnosis not present

## 2017-12-02 DIAGNOSIS — R634 Abnormal weight loss: Secondary | ICD-10-CM | POA: Diagnosis not present

## 2017-12-03 DIAGNOSIS — E119 Type 2 diabetes mellitus without complications: Secondary | ICD-10-CM | POA: Diagnosis not present

## 2017-12-03 DIAGNOSIS — Z9181 History of falling: Secondary | ICD-10-CM | POA: Diagnosis not present

## 2017-12-03 DIAGNOSIS — R634 Abnormal weight loss: Secondary | ICD-10-CM | POA: Diagnosis not present

## 2017-12-03 DIAGNOSIS — G20 Parkinson's disease: Secondary | ICD-10-CM | POA: Diagnosis not present

## 2017-12-03 DIAGNOSIS — S42302S Unspecified fracture of shaft of humerus, left arm, sequela: Secondary | ICD-10-CM | POA: Diagnosis not present

## 2017-12-03 DIAGNOSIS — R1310 Dysphagia, unspecified: Secondary | ICD-10-CM | POA: Diagnosis not present

## 2017-12-05 DIAGNOSIS — R634 Abnormal weight loss: Secondary | ICD-10-CM | POA: Diagnosis not present

## 2017-12-05 DIAGNOSIS — R1310 Dysphagia, unspecified: Secondary | ICD-10-CM | POA: Diagnosis not present

## 2017-12-05 DIAGNOSIS — Z9181 History of falling: Secondary | ICD-10-CM | POA: Diagnosis not present

## 2017-12-05 DIAGNOSIS — S42302S Unspecified fracture of shaft of humerus, left arm, sequela: Secondary | ICD-10-CM | POA: Diagnosis not present

## 2017-12-05 DIAGNOSIS — G20 Parkinson's disease: Secondary | ICD-10-CM | POA: Diagnosis not present

## 2017-12-05 DIAGNOSIS — E119 Type 2 diabetes mellitus without complications: Secondary | ICD-10-CM | POA: Diagnosis not present

## 2017-12-07 DIAGNOSIS — Z9181 History of falling: Secondary | ICD-10-CM | POA: Diagnosis not present

## 2017-12-07 DIAGNOSIS — E119 Type 2 diabetes mellitus without complications: Secondary | ICD-10-CM | POA: Diagnosis not present

## 2017-12-07 DIAGNOSIS — R634 Abnormal weight loss: Secondary | ICD-10-CM | POA: Diagnosis not present

## 2017-12-07 DIAGNOSIS — H903 Sensorineural hearing loss, bilateral: Secondary | ICD-10-CM | POA: Diagnosis not present

## 2017-12-07 DIAGNOSIS — G20 Parkinson's disease: Secondary | ICD-10-CM | POA: Diagnosis not present

## 2017-12-07 DIAGNOSIS — S42302S Unspecified fracture of shaft of humerus, left arm, sequela: Secondary | ICD-10-CM | POA: Diagnosis not present

## 2017-12-07 DIAGNOSIS — R1310 Dysphagia, unspecified: Secondary | ICD-10-CM | POA: Diagnosis not present

## 2017-12-10 DIAGNOSIS — Z9181 History of falling: Secondary | ICD-10-CM | POA: Diagnosis not present

## 2017-12-10 DIAGNOSIS — G20 Parkinson's disease: Secondary | ICD-10-CM | POA: Diagnosis not present

## 2017-12-10 DIAGNOSIS — S42302S Unspecified fracture of shaft of humerus, left arm, sequela: Secondary | ICD-10-CM | POA: Diagnosis not present

## 2017-12-10 DIAGNOSIS — E119 Type 2 diabetes mellitus without complications: Secondary | ICD-10-CM | POA: Diagnosis not present

## 2017-12-10 DIAGNOSIS — R1310 Dysphagia, unspecified: Secondary | ICD-10-CM | POA: Diagnosis not present

## 2017-12-10 DIAGNOSIS — R634 Abnormal weight loss: Secondary | ICD-10-CM | POA: Diagnosis not present

## 2017-12-12 DIAGNOSIS — S42302S Unspecified fracture of shaft of humerus, left arm, sequela: Secondary | ICD-10-CM | POA: Diagnosis not present

## 2017-12-12 DIAGNOSIS — G20 Parkinson's disease: Secondary | ICD-10-CM | POA: Diagnosis not present

## 2017-12-12 DIAGNOSIS — G231 Progressive supranuclear ophthalmoplegia [Steele-Richardson-Olszewski]: Secondary | ICD-10-CM | POA: Diagnosis not present

## 2017-12-12 DIAGNOSIS — Z9181 History of falling: Secondary | ICD-10-CM | POA: Diagnosis not present

## 2017-12-12 DIAGNOSIS — K117 Disturbances of salivary secretion: Secondary | ICD-10-CM | POA: Diagnosis not present

## 2017-12-12 DIAGNOSIS — E119 Type 2 diabetes mellitus without complications: Secondary | ICD-10-CM | POA: Diagnosis not present

## 2017-12-12 DIAGNOSIS — R634 Abnormal weight loss: Secondary | ICD-10-CM | POA: Diagnosis not present

## 2017-12-12 DIAGNOSIS — R453 Demoralization and apathy: Secondary | ICD-10-CM | POA: Diagnosis not present

## 2017-12-12 DIAGNOSIS — R1310 Dysphagia, unspecified: Secondary | ICD-10-CM | POA: Diagnosis not present

## 2017-12-14 DIAGNOSIS — Z9181 History of falling: Secondary | ICD-10-CM | POA: Diagnosis not present

## 2017-12-14 DIAGNOSIS — S42302S Unspecified fracture of shaft of humerus, left arm, sequela: Secondary | ICD-10-CM | POA: Diagnosis not present

## 2017-12-14 DIAGNOSIS — E119 Type 2 diabetes mellitus without complications: Secondary | ICD-10-CM | POA: Diagnosis not present

## 2017-12-14 DIAGNOSIS — R634 Abnormal weight loss: Secondary | ICD-10-CM | POA: Diagnosis not present

## 2017-12-14 DIAGNOSIS — R1310 Dysphagia, unspecified: Secondary | ICD-10-CM | POA: Diagnosis not present

## 2017-12-14 DIAGNOSIS — G20 Parkinson's disease: Secondary | ICD-10-CM | POA: Diagnosis not present

## 2017-12-17 DIAGNOSIS — G20 Parkinson's disease: Secondary | ICD-10-CM | POA: Diagnosis not present

## 2017-12-17 DIAGNOSIS — Z9181 History of falling: Secondary | ICD-10-CM | POA: Diagnosis not present

## 2017-12-17 DIAGNOSIS — E119 Type 2 diabetes mellitus without complications: Secondary | ICD-10-CM | POA: Diagnosis not present

## 2017-12-17 DIAGNOSIS — S42302S Unspecified fracture of shaft of humerus, left arm, sequela: Secondary | ICD-10-CM | POA: Diagnosis not present

## 2017-12-17 DIAGNOSIS — R1310 Dysphagia, unspecified: Secondary | ICD-10-CM | POA: Diagnosis not present

## 2017-12-17 DIAGNOSIS — R634 Abnormal weight loss: Secondary | ICD-10-CM | POA: Diagnosis not present

## 2017-12-19 DIAGNOSIS — E119 Type 2 diabetes mellitus without complications: Secondary | ICD-10-CM | POA: Diagnosis not present

## 2017-12-19 DIAGNOSIS — G20 Parkinson's disease: Secondary | ICD-10-CM | POA: Diagnosis not present

## 2017-12-19 DIAGNOSIS — S42302S Unspecified fracture of shaft of humerus, left arm, sequela: Secondary | ICD-10-CM | POA: Diagnosis not present

## 2017-12-19 DIAGNOSIS — R634 Abnormal weight loss: Secondary | ICD-10-CM | POA: Diagnosis not present

## 2017-12-19 DIAGNOSIS — R1310 Dysphagia, unspecified: Secondary | ICD-10-CM | POA: Diagnosis not present

## 2017-12-19 DIAGNOSIS — Z9181 History of falling: Secondary | ICD-10-CM | POA: Diagnosis not present

## 2017-12-21 DIAGNOSIS — S42302S Unspecified fracture of shaft of humerus, left arm, sequela: Secondary | ICD-10-CM | POA: Diagnosis not present

## 2017-12-21 DIAGNOSIS — E119 Type 2 diabetes mellitus without complications: Secondary | ICD-10-CM | POA: Diagnosis not present

## 2017-12-21 DIAGNOSIS — G20 Parkinson's disease: Secondary | ICD-10-CM | POA: Diagnosis not present

## 2017-12-21 DIAGNOSIS — R634 Abnormal weight loss: Secondary | ICD-10-CM | POA: Diagnosis not present

## 2017-12-21 DIAGNOSIS — R1310 Dysphagia, unspecified: Secondary | ICD-10-CM | POA: Diagnosis not present

## 2017-12-21 DIAGNOSIS — Z9181 History of falling: Secondary | ICD-10-CM | POA: Diagnosis not present

## 2017-12-24 DIAGNOSIS — E119 Type 2 diabetes mellitus without complications: Secondary | ICD-10-CM | POA: Diagnosis not present

## 2017-12-24 DIAGNOSIS — R634 Abnormal weight loss: Secondary | ICD-10-CM | POA: Diagnosis not present

## 2017-12-24 DIAGNOSIS — S42302S Unspecified fracture of shaft of humerus, left arm, sequela: Secondary | ICD-10-CM | POA: Diagnosis not present

## 2017-12-24 DIAGNOSIS — Z9181 History of falling: Secondary | ICD-10-CM | POA: Diagnosis not present

## 2017-12-24 DIAGNOSIS — G20 Parkinson's disease: Secondary | ICD-10-CM | POA: Diagnosis not present

## 2017-12-24 DIAGNOSIS — R1310 Dysphagia, unspecified: Secondary | ICD-10-CM | POA: Diagnosis not present

## 2017-12-25 DIAGNOSIS — R1310 Dysphagia, unspecified: Secondary | ICD-10-CM | POA: Diagnosis not present

## 2017-12-25 DIAGNOSIS — R634 Abnormal weight loss: Secondary | ICD-10-CM | POA: Diagnosis not present

## 2017-12-25 DIAGNOSIS — E119 Type 2 diabetes mellitus without complications: Secondary | ICD-10-CM | POA: Diagnosis not present

## 2017-12-25 DIAGNOSIS — G20 Parkinson's disease: Secondary | ICD-10-CM | POA: Diagnosis not present

## 2017-12-25 DIAGNOSIS — Z9181 History of falling: Secondary | ICD-10-CM | POA: Diagnosis not present

## 2017-12-25 DIAGNOSIS — S42302S Unspecified fracture of shaft of humerus, left arm, sequela: Secondary | ICD-10-CM | POA: Diagnosis not present

## 2017-12-28 DIAGNOSIS — S42302S Unspecified fracture of shaft of humerus, left arm, sequela: Secondary | ICD-10-CM | POA: Diagnosis not present

## 2017-12-28 DIAGNOSIS — G20 Parkinson's disease: Secondary | ICD-10-CM | POA: Diagnosis not present

## 2017-12-28 DIAGNOSIS — R634 Abnormal weight loss: Secondary | ICD-10-CM | POA: Diagnosis not present

## 2017-12-28 DIAGNOSIS — E119 Type 2 diabetes mellitus without complications: Secondary | ICD-10-CM | POA: Diagnosis not present

## 2017-12-28 DIAGNOSIS — Z9181 History of falling: Secondary | ICD-10-CM | POA: Diagnosis not present

## 2017-12-28 DIAGNOSIS — R1310 Dysphagia, unspecified: Secondary | ICD-10-CM | POA: Diagnosis not present

## 2017-12-31 DIAGNOSIS — R634 Abnormal weight loss: Secondary | ICD-10-CM | POA: Diagnosis not present

## 2017-12-31 DIAGNOSIS — E119 Type 2 diabetes mellitus without complications: Secondary | ICD-10-CM | POA: Diagnosis not present

## 2017-12-31 DIAGNOSIS — G20 Parkinson's disease: Secondary | ICD-10-CM | POA: Diagnosis not present

## 2017-12-31 DIAGNOSIS — Z9181 History of falling: Secondary | ICD-10-CM | POA: Diagnosis not present

## 2017-12-31 DIAGNOSIS — S42302S Unspecified fracture of shaft of humerus, left arm, sequela: Secondary | ICD-10-CM | POA: Diagnosis not present

## 2017-12-31 DIAGNOSIS — R1310 Dysphagia, unspecified: Secondary | ICD-10-CM | POA: Diagnosis not present

## 2018-01-01 DIAGNOSIS — G20 Parkinson's disease: Secondary | ICD-10-CM | POA: Diagnosis not present

## 2018-01-01 DIAGNOSIS — Z9181 History of falling: Secondary | ICD-10-CM | POA: Diagnosis not present

## 2018-01-01 DIAGNOSIS — S42302S Unspecified fracture of shaft of humerus, left arm, sequela: Secondary | ICD-10-CM | POA: Diagnosis not present

## 2018-01-01 DIAGNOSIS — R634 Abnormal weight loss: Secondary | ICD-10-CM | POA: Diagnosis not present

## 2018-01-01 DIAGNOSIS — E119 Type 2 diabetes mellitus without complications: Secondary | ICD-10-CM | POA: Diagnosis not present

## 2018-01-01 DIAGNOSIS — R1310 Dysphagia, unspecified: Secondary | ICD-10-CM | POA: Diagnosis not present

## 2018-01-02 DIAGNOSIS — S42302S Unspecified fracture of shaft of humerus, left arm, sequela: Secondary | ICD-10-CM | POA: Diagnosis not present

## 2018-01-02 DIAGNOSIS — E119 Type 2 diabetes mellitus without complications: Secondary | ICD-10-CM | POA: Diagnosis not present

## 2018-01-02 DIAGNOSIS — R1310 Dysphagia, unspecified: Secondary | ICD-10-CM | POA: Diagnosis not present

## 2018-01-02 DIAGNOSIS — R634 Abnormal weight loss: Secondary | ICD-10-CM | POA: Diagnosis not present

## 2018-01-02 DIAGNOSIS — Z9181 History of falling: Secondary | ICD-10-CM | POA: Diagnosis not present

## 2018-01-02 DIAGNOSIS — G20 Parkinson's disease: Secondary | ICD-10-CM | POA: Diagnosis not present

## 2018-01-02 DIAGNOSIS — N401 Enlarged prostate with lower urinary tract symptoms: Secondary | ICD-10-CM | POA: Diagnosis not present

## 2018-01-04 DIAGNOSIS — E119 Type 2 diabetes mellitus without complications: Secondary | ICD-10-CM | POA: Diagnosis not present

## 2018-01-04 DIAGNOSIS — Z9181 History of falling: Secondary | ICD-10-CM | POA: Diagnosis not present

## 2018-01-04 DIAGNOSIS — S42302S Unspecified fracture of shaft of humerus, left arm, sequela: Secondary | ICD-10-CM | POA: Diagnosis not present

## 2018-01-04 DIAGNOSIS — R634 Abnormal weight loss: Secondary | ICD-10-CM | POA: Diagnosis not present

## 2018-01-04 DIAGNOSIS — R1310 Dysphagia, unspecified: Secondary | ICD-10-CM | POA: Diagnosis not present

## 2018-01-04 DIAGNOSIS — G20 Parkinson's disease: Secondary | ICD-10-CM | POA: Diagnosis not present

## 2018-01-07 DIAGNOSIS — G20 Parkinson's disease: Secondary | ICD-10-CM | POA: Diagnosis not present

## 2018-01-07 DIAGNOSIS — R1310 Dysphagia, unspecified: Secondary | ICD-10-CM | POA: Diagnosis not present

## 2018-01-07 DIAGNOSIS — R634 Abnormal weight loss: Secondary | ICD-10-CM | POA: Diagnosis not present

## 2018-01-07 DIAGNOSIS — Z9181 History of falling: Secondary | ICD-10-CM | POA: Diagnosis not present

## 2018-01-07 DIAGNOSIS — E119 Type 2 diabetes mellitus without complications: Secondary | ICD-10-CM | POA: Diagnosis not present

## 2018-01-07 DIAGNOSIS — S42302S Unspecified fracture of shaft of humerus, left arm, sequela: Secondary | ICD-10-CM | POA: Diagnosis not present

## 2018-01-08 DIAGNOSIS — R634 Abnormal weight loss: Secondary | ICD-10-CM | POA: Diagnosis not present

## 2018-01-08 DIAGNOSIS — Z9181 History of falling: Secondary | ICD-10-CM | POA: Diagnosis not present

## 2018-01-08 DIAGNOSIS — R1310 Dysphagia, unspecified: Secondary | ICD-10-CM | POA: Diagnosis not present

## 2018-01-08 DIAGNOSIS — E119 Type 2 diabetes mellitus without complications: Secondary | ICD-10-CM | POA: Diagnosis not present

## 2018-01-08 DIAGNOSIS — S42302S Unspecified fracture of shaft of humerus, left arm, sequela: Secondary | ICD-10-CM | POA: Diagnosis not present

## 2018-01-08 DIAGNOSIS — G20 Parkinson's disease: Secondary | ICD-10-CM | POA: Diagnosis not present

## 2018-01-09 DIAGNOSIS — G20 Parkinson's disease: Secondary | ICD-10-CM | POA: Diagnosis not present

## 2018-01-09 DIAGNOSIS — S42302S Unspecified fracture of shaft of humerus, left arm, sequela: Secondary | ICD-10-CM | POA: Diagnosis not present

## 2018-01-09 DIAGNOSIS — E119 Type 2 diabetes mellitus without complications: Secondary | ICD-10-CM | POA: Diagnosis not present

## 2018-01-09 DIAGNOSIS — R634 Abnormal weight loss: Secondary | ICD-10-CM | POA: Diagnosis not present

## 2018-01-09 DIAGNOSIS — R1310 Dysphagia, unspecified: Secondary | ICD-10-CM | POA: Diagnosis not present

## 2018-01-09 DIAGNOSIS — Z9181 History of falling: Secondary | ICD-10-CM | POA: Diagnosis not present

## 2018-01-11 DIAGNOSIS — G20 Parkinson's disease: Secondary | ICD-10-CM | POA: Diagnosis not present

## 2018-01-11 DIAGNOSIS — R634 Abnormal weight loss: Secondary | ICD-10-CM | POA: Diagnosis not present

## 2018-01-11 DIAGNOSIS — R1310 Dysphagia, unspecified: Secondary | ICD-10-CM | POA: Diagnosis not present

## 2018-01-11 DIAGNOSIS — E119 Type 2 diabetes mellitus without complications: Secondary | ICD-10-CM | POA: Diagnosis not present

## 2018-01-11 DIAGNOSIS — Z9181 History of falling: Secondary | ICD-10-CM | POA: Diagnosis not present

## 2018-01-11 DIAGNOSIS — S42302S Unspecified fracture of shaft of humerus, left arm, sequela: Secondary | ICD-10-CM | POA: Diagnosis not present

## 2018-01-14 DIAGNOSIS — S42302S Unspecified fracture of shaft of humerus, left arm, sequela: Secondary | ICD-10-CM | POA: Diagnosis not present

## 2018-01-14 DIAGNOSIS — R634 Abnormal weight loss: Secondary | ICD-10-CM | POA: Diagnosis not present

## 2018-01-14 DIAGNOSIS — E119 Type 2 diabetes mellitus without complications: Secondary | ICD-10-CM | POA: Diagnosis not present

## 2018-01-14 DIAGNOSIS — Z9181 History of falling: Secondary | ICD-10-CM | POA: Diagnosis not present

## 2018-01-14 DIAGNOSIS — G20 Parkinson's disease: Secondary | ICD-10-CM | POA: Diagnosis not present

## 2018-01-14 DIAGNOSIS — R1310 Dysphagia, unspecified: Secondary | ICD-10-CM | POA: Diagnosis not present

## 2018-01-16 DIAGNOSIS — S42302S Unspecified fracture of shaft of humerus, left arm, sequela: Secondary | ICD-10-CM | POA: Diagnosis not present

## 2018-01-16 DIAGNOSIS — R634 Abnormal weight loss: Secondary | ICD-10-CM | POA: Diagnosis not present

## 2018-01-16 DIAGNOSIS — R1310 Dysphagia, unspecified: Secondary | ICD-10-CM | POA: Diagnosis not present

## 2018-01-16 DIAGNOSIS — E119 Type 2 diabetes mellitus without complications: Secondary | ICD-10-CM | POA: Diagnosis not present

## 2018-01-16 DIAGNOSIS — G20 Parkinson's disease: Secondary | ICD-10-CM | POA: Diagnosis not present

## 2018-01-16 DIAGNOSIS — Z9181 History of falling: Secondary | ICD-10-CM | POA: Diagnosis not present

## 2018-01-18 DIAGNOSIS — S42302S Unspecified fracture of shaft of humerus, left arm, sequela: Secondary | ICD-10-CM | POA: Diagnosis not present

## 2018-01-18 DIAGNOSIS — E119 Type 2 diabetes mellitus without complications: Secondary | ICD-10-CM | POA: Diagnosis not present

## 2018-01-18 DIAGNOSIS — Z9181 History of falling: Secondary | ICD-10-CM | POA: Diagnosis not present

## 2018-01-18 DIAGNOSIS — R1310 Dysphagia, unspecified: Secondary | ICD-10-CM | POA: Diagnosis not present

## 2018-01-18 DIAGNOSIS — G20 Parkinson's disease: Secondary | ICD-10-CM | POA: Diagnosis not present

## 2018-01-18 DIAGNOSIS — R634 Abnormal weight loss: Secondary | ICD-10-CM | POA: Diagnosis not present

## 2018-01-21 DIAGNOSIS — E119 Type 2 diabetes mellitus without complications: Secondary | ICD-10-CM | POA: Diagnosis not present

## 2018-01-21 DIAGNOSIS — G20 Parkinson's disease: Secondary | ICD-10-CM | POA: Diagnosis not present

## 2018-01-21 DIAGNOSIS — R634 Abnormal weight loss: Secondary | ICD-10-CM | POA: Diagnosis not present

## 2018-01-21 DIAGNOSIS — Z9181 History of falling: Secondary | ICD-10-CM | POA: Diagnosis not present

## 2018-01-21 DIAGNOSIS — R1310 Dysphagia, unspecified: Secondary | ICD-10-CM | POA: Diagnosis not present

## 2018-01-21 DIAGNOSIS — S42302S Unspecified fracture of shaft of humerus, left arm, sequela: Secondary | ICD-10-CM | POA: Diagnosis not present

## 2018-01-22 DIAGNOSIS — G20 Parkinson's disease: Secondary | ICD-10-CM | POA: Diagnosis not present

## 2018-01-22 DIAGNOSIS — Z9181 History of falling: Secondary | ICD-10-CM | POA: Diagnosis not present

## 2018-01-22 DIAGNOSIS — E119 Type 2 diabetes mellitus without complications: Secondary | ICD-10-CM | POA: Diagnosis not present

## 2018-01-22 DIAGNOSIS — S42302S Unspecified fracture of shaft of humerus, left arm, sequela: Secondary | ICD-10-CM | POA: Diagnosis not present

## 2018-01-22 DIAGNOSIS — R1310 Dysphagia, unspecified: Secondary | ICD-10-CM | POA: Diagnosis not present

## 2018-01-22 DIAGNOSIS — R634 Abnormal weight loss: Secondary | ICD-10-CM | POA: Diagnosis not present

## 2018-01-23 DIAGNOSIS — E119 Type 2 diabetes mellitus without complications: Secondary | ICD-10-CM | POA: Diagnosis not present

## 2018-01-23 DIAGNOSIS — R1310 Dysphagia, unspecified: Secondary | ICD-10-CM | POA: Diagnosis not present

## 2018-01-23 DIAGNOSIS — S42302S Unspecified fracture of shaft of humerus, left arm, sequela: Secondary | ICD-10-CM | POA: Diagnosis not present

## 2018-01-23 DIAGNOSIS — S42212D Unspecified displaced fracture of surgical neck of left humerus, subsequent encounter for fracture with routine healing: Secondary | ICD-10-CM | POA: Diagnosis not present

## 2018-01-23 DIAGNOSIS — Z9181 History of falling: Secondary | ICD-10-CM | POA: Diagnosis not present

## 2018-01-23 DIAGNOSIS — M7532 Calcific tendinitis of left shoulder: Secondary | ICD-10-CM | POA: Diagnosis not present

## 2018-01-23 DIAGNOSIS — G20 Parkinson's disease: Secondary | ICD-10-CM | POA: Diagnosis not present

## 2018-01-23 DIAGNOSIS — R634 Abnormal weight loss: Secondary | ICD-10-CM | POA: Diagnosis not present

## 2018-01-28 DIAGNOSIS — R1310 Dysphagia, unspecified: Secondary | ICD-10-CM | POA: Diagnosis not present

## 2018-01-28 DIAGNOSIS — E119 Type 2 diabetes mellitus without complications: Secondary | ICD-10-CM | POA: Diagnosis not present

## 2018-01-28 DIAGNOSIS — Z9181 History of falling: Secondary | ICD-10-CM | POA: Diagnosis not present

## 2018-01-28 DIAGNOSIS — R634 Abnormal weight loss: Secondary | ICD-10-CM | POA: Diagnosis not present

## 2018-01-28 DIAGNOSIS — S42302S Unspecified fracture of shaft of humerus, left arm, sequela: Secondary | ICD-10-CM | POA: Diagnosis not present

## 2018-01-28 DIAGNOSIS — G20 Parkinson's disease: Secondary | ICD-10-CM | POA: Diagnosis not present

## 2018-01-30 DIAGNOSIS — R1310 Dysphagia, unspecified: Secondary | ICD-10-CM | POA: Diagnosis not present

## 2018-01-30 DIAGNOSIS — R634 Abnormal weight loss: Secondary | ICD-10-CM | POA: Diagnosis not present

## 2018-01-30 DIAGNOSIS — Z9181 History of falling: Secondary | ICD-10-CM | POA: Diagnosis not present

## 2018-01-30 DIAGNOSIS — E119 Type 2 diabetes mellitus without complications: Secondary | ICD-10-CM | POA: Diagnosis not present

## 2018-01-30 DIAGNOSIS — S42302S Unspecified fracture of shaft of humerus, left arm, sequela: Secondary | ICD-10-CM | POA: Diagnosis not present

## 2018-01-30 DIAGNOSIS — G20 Parkinson's disease: Secondary | ICD-10-CM | POA: Diagnosis not present

## 2018-01-31 DIAGNOSIS — R634 Abnormal weight loss: Secondary | ICD-10-CM | POA: Diagnosis not present

## 2018-01-31 DIAGNOSIS — G20 Parkinson's disease: Secondary | ICD-10-CM | POA: Diagnosis not present

## 2018-01-31 DIAGNOSIS — E119 Type 2 diabetes mellitus without complications: Secondary | ICD-10-CM | POA: Diagnosis not present

## 2018-01-31 DIAGNOSIS — R1310 Dysphagia, unspecified: Secondary | ICD-10-CM | POA: Diagnosis not present

## 2018-01-31 DIAGNOSIS — S42302S Unspecified fracture of shaft of humerus, left arm, sequela: Secondary | ICD-10-CM | POA: Diagnosis not present

## 2018-01-31 DIAGNOSIS — Z9181 History of falling: Secondary | ICD-10-CM | POA: Diagnosis not present

## 2018-02-01 DIAGNOSIS — E119 Type 2 diabetes mellitus without complications: Secondary | ICD-10-CM | POA: Diagnosis not present

## 2018-02-01 DIAGNOSIS — R634 Abnormal weight loss: Secondary | ICD-10-CM | POA: Diagnosis not present

## 2018-02-01 DIAGNOSIS — Z9181 History of falling: Secondary | ICD-10-CM | POA: Diagnosis not present

## 2018-02-01 DIAGNOSIS — R1310 Dysphagia, unspecified: Secondary | ICD-10-CM | POA: Diagnosis not present

## 2018-02-01 DIAGNOSIS — S42302S Unspecified fracture of shaft of humerus, left arm, sequela: Secondary | ICD-10-CM | POA: Diagnosis not present

## 2018-02-01 DIAGNOSIS — G20 Parkinson's disease: Secondary | ICD-10-CM | POA: Diagnosis not present

## 2018-02-02 DIAGNOSIS — E119 Type 2 diabetes mellitus without complications: Secondary | ICD-10-CM | POA: Diagnosis not present

## 2018-02-02 DIAGNOSIS — R634 Abnormal weight loss: Secondary | ICD-10-CM | POA: Diagnosis not present

## 2018-02-02 DIAGNOSIS — R1310 Dysphagia, unspecified: Secondary | ICD-10-CM | POA: Diagnosis not present

## 2018-02-02 DIAGNOSIS — S42302S Unspecified fracture of shaft of humerus, left arm, sequela: Secondary | ICD-10-CM | POA: Diagnosis not present

## 2018-02-02 DIAGNOSIS — N401 Enlarged prostate with lower urinary tract symptoms: Secondary | ICD-10-CM | POA: Diagnosis not present

## 2018-02-02 DIAGNOSIS — Z9181 History of falling: Secondary | ICD-10-CM | POA: Diagnosis not present

## 2018-02-02 DIAGNOSIS — G20 Parkinson's disease: Secondary | ICD-10-CM | POA: Diagnosis not present

## 2018-02-04 DIAGNOSIS — S42302S Unspecified fracture of shaft of humerus, left arm, sequela: Secondary | ICD-10-CM | POA: Diagnosis not present

## 2018-02-04 DIAGNOSIS — R634 Abnormal weight loss: Secondary | ICD-10-CM | POA: Diagnosis not present

## 2018-02-04 DIAGNOSIS — G20 Parkinson's disease: Secondary | ICD-10-CM | POA: Diagnosis not present

## 2018-02-04 DIAGNOSIS — Z9181 History of falling: Secondary | ICD-10-CM | POA: Diagnosis not present

## 2018-02-04 DIAGNOSIS — E119 Type 2 diabetes mellitus without complications: Secondary | ICD-10-CM | POA: Diagnosis not present

## 2018-02-04 DIAGNOSIS — R1310 Dysphagia, unspecified: Secondary | ICD-10-CM | POA: Diagnosis not present

## 2018-02-06 DIAGNOSIS — G20 Parkinson's disease: Secondary | ICD-10-CM | POA: Diagnosis not present

## 2018-02-06 DIAGNOSIS — N401 Enlarged prostate with lower urinary tract symptoms: Secondary | ICD-10-CM | POA: Diagnosis not present

## 2018-02-06 DIAGNOSIS — S42302S Unspecified fracture of shaft of humerus, left arm, sequela: Secondary | ICD-10-CM | POA: Diagnosis not present

## 2018-02-06 DIAGNOSIS — R634 Abnormal weight loss: Secondary | ICD-10-CM | POA: Diagnosis not present

## 2018-02-06 DIAGNOSIS — Z9181 History of falling: Secondary | ICD-10-CM | POA: Diagnosis not present

## 2018-02-06 DIAGNOSIS — E119 Type 2 diabetes mellitus without complications: Secondary | ICD-10-CM | POA: Diagnosis not present

## 2018-02-06 DIAGNOSIS — R1310 Dysphagia, unspecified: Secondary | ICD-10-CM | POA: Diagnosis not present

## 2018-02-06 DIAGNOSIS — R350 Frequency of micturition: Secondary | ICD-10-CM | POA: Diagnosis not present

## 2018-02-06 DIAGNOSIS — N3000 Acute cystitis without hematuria: Secondary | ICD-10-CM | POA: Diagnosis not present

## 2018-02-08 DIAGNOSIS — G20 Parkinson's disease: Secondary | ICD-10-CM | POA: Diagnosis not present

## 2018-02-08 DIAGNOSIS — S42302S Unspecified fracture of shaft of humerus, left arm, sequela: Secondary | ICD-10-CM | POA: Diagnosis not present

## 2018-02-08 DIAGNOSIS — Z9181 History of falling: Secondary | ICD-10-CM | POA: Diagnosis not present

## 2018-02-08 DIAGNOSIS — R1310 Dysphagia, unspecified: Secondary | ICD-10-CM | POA: Diagnosis not present

## 2018-02-08 DIAGNOSIS — E119 Type 2 diabetes mellitus without complications: Secondary | ICD-10-CM | POA: Diagnosis not present

## 2018-02-08 DIAGNOSIS — R634 Abnormal weight loss: Secondary | ICD-10-CM | POA: Diagnosis not present

## 2018-02-11 DIAGNOSIS — S42302S Unspecified fracture of shaft of humerus, left arm, sequela: Secondary | ICD-10-CM | POA: Diagnosis not present

## 2018-02-11 DIAGNOSIS — R1310 Dysphagia, unspecified: Secondary | ICD-10-CM | POA: Diagnosis not present

## 2018-02-11 DIAGNOSIS — G20 Parkinson's disease: Secondary | ICD-10-CM | POA: Diagnosis not present

## 2018-02-11 DIAGNOSIS — Z9181 History of falling: Secondary | ICD-10-CM | POA: Diagnosis not present

## 2018-02-11 DIAGNOSIS — R634 Abnormal weight loss: Secondary | ICD-10-CM | POA: Diagnosis not present

## 2018-02-11 DIAGNOSIS — E119 Type 2 diabetes mellitus without complications: Secondary | ICD-10-CM | POA: Diagnosis not present

## 2018-02-13 DIAGNOSIS — Z9181 History of falling: Secondary | ICD-10-CM | POA: Diagnosis not present

## 2018-02-13 DIAGNOSIS — R634 Abnormal weight loss: Secondary | ICD-10-CM | POA: Diagnosis not present

## 2018-02-13 DIAGNOSIS — E119 Type 2 diabetes mellitus without complications: Secondary | ICD-10-CM | POA: Diagnosis not present

## 2018-02-13 DIAGNOSIS — G20 Parkinson's disease: Secondary | ICD-10-CM | POA: Diagnosis not present

## 2018-02-13 DIAGNOSIS — R1310 Dysphagia, unspecified: Secondary | ICD-10-CM | POA: Diagnosis not present

## 2018-02-13 DIAGNOSIS — S42302S Unspecified fracture of shaft of humerus, left arm, sequela: Secondary | ICD-10-CM | POA: Diagnosis not present

## 2018-02-14 DIAGNOSIS — R1310 Dysphagia, unspecified: Secondary | ICD-10-CM | POA: Diagnosis not present

## 2018-02-14 DIAGNOSIS — E119 Type 2 diabetes mellitus without complications: Secondary | ICD-10-CM | POA: Diagnosis not present

## 2018-02-14 DIAGNOSIS — S42302S Unspecified fracture of shaft of humerus, left arm, sequela: Secondary | ICD-10-CM | POA: Diagnosis not present

## 2018-02-14 DIAGNOSIS — G20 Parkinson's disease: Secondary | ICD-10-CM | POA: Diagnosis not present

## 2018-02-14 DIAGNOSIS — R634 Abnormal weight loss: Secondary | ICD-10-CM | POA: Diagnosis not present

## 2018-02-14 DIAGNOSIS — Z9181 History of falling: Secondary | ICD-10-CM | POA: Diagnosis not present

## 2018-02-15 DIAGNOSIS — E119 Type 2 diabetes mellitus without complications: Secondary | ICD-10-CM | POA: Diagnosis not present

## 2018-02-15 DIAGNOSIS — S42302S Unspecified fracture of shaft of humerus, left arm, sequela: Secondary | ICD-10-CM | POA: Diagnosis not present

## 2018-02-15 DIAGNOSIS — R634 Abnormal weight loss: Secondary | ICD-10-CM | POA: Diagnosis not present

## 2018-02-15 DIAGNOSIS — R1310 Dysphagia, unspecified: Secondary | ICD-10-CM | POA: Diagnosis not present

## 2018-02-15 DIAGNOSIS — Z9181 History of falling: Secondary | ICD-10-CM | POA: Diagnosis not present

## 2018-02-15 DIAGNOSIS — G20 Parkinson's disease: Secondary | ICD-10-CM | POA: Diagnosis not present

## 2018-02-18 DIAGNOSIS — R634 Abnormal weight loss: Secondary | ICD-10-CM | POA: Diagnosis not present

## 2018-02-18 DIAGNOSIS — E119 Type 2 diabetes mellitus without complications: Secondary | ICD-10-CM | POA: Diagnosis not present

## 2018-02-18 DIAGNOSIS — Z9181 History of falling: Secondary | ICD-10-CM | POA: Diagnosis not present

## 2018-02-18 DIAGNOSIS — S42302S Unspecified fracture of shaft of humerus, left arm, sequela: Secondary | ICD-10-CM | POA: Diagnosis not present

## 2018-02-18 DIAGNOSIS — R1310 Dysphagia, unspecified: Secondary | ICD-10-CM | POA: Diagnosis not present

## 2018-02-18 DIAGNOSIS — G20 Parkinson's disease: Secondary | ICD-10-CM | POA: Diagnosis not present

## 2018-02-20 DIAGNOSIS — G20 Parkinson's disease: Secondary | ICD-10-CM | POA: Diagnosis not present

## 2018-02-20 DIAGNOSIS — S42302S Unspecified fracture of shaft of humerus, left arm, sequela: Secondary | ICD-10-CM | POA: Diagnosis not present

## 2018-02-20 DIAGNOSIS — R634 Abnormal weight loss: Secondary | ICD-10-CM | POA: Diagnosis not present

## 2018-02-20 DIAGNOSIS — E119 Type 2 diabetes mellitus without complications: Secondary | ICD-10-CM | POA: Diagnosis not present

## 2018-02-20 DIAGNOSIS — R1310 Dysphagia, unspecified: Secondary | ICD-10-CM | POA: Diagnosis not present

## 2018-02-20 DIAGNOSIS — Z9181 History of falling: Secondary | ICD-10-CM | POA: Diagnosis not present

## 2018-02-21 DIAGNOSIS — E119 Type 2 diabetes mellitus without complications: Secondary | ICD-10-CM | POA: Diagnosis not present

## 2018-02-21 DIAGNOSIS — G20 Parkinson's disease: Secondary | ICD-10-CM | POA: Diagnosis not present

## 2018-02-21 DIAGNOSIS — S42302S Unspecified fracture of shaft of humerus, left arm, sequela: Secondary | ICD-10-CM | POA: Diagnosis not present

## 2018-02-21 DIAGNOSIS — Z9181 History of falling: Secondary | ICD-10-CM | POA: Diagnosis not present

## 2018-02-21 DIAGNOSIS — R634 Abnormal weight loss: Secondary | ICD-10-CM | POA: Diagnosis not present

## 2018-02-21 DIAGNOSIS — R1310 Dysphagia, unspecified: Secondary | ICD-10-CM | POA: Diagnosis not present

## 2018-02-22 DIAGNOSIS — R634 Abnormal weight loss: Secondary | ICD-10-CM | POA: Diagnosis not present

## 2018-02-22 DIAGNOSIS — E119 Type 2 diabetes mellitus without complications: Secondary | ICD-10-CM | POA: Diagnosis not present

## 2018-02-22 DIAGNOSIS — Z9181 History of falling: Secondary | ICD-10-CM | POA: Diagnosis not present

## 2018-02-22 DIAGNOSIS — R1310 Dysphagia, unspecified: Secondary | ICD-10-CM | POA: Diagnosis not present

## 2018-02-22 DIAGNOSIS — S42302S Unspecified fracture of shaft of humerus, left arm, sequela: Secondary | ICD-10-CM | POA: Diagnosis not present

## 2018-02-22 DIAGNOSIS — G20 Parkinson's disease: Secondary | ICD-10-CM | POA: Diagnosis not present

## 2018-02-25 DIAGNOSIS — E119 Type 2 diabetes mellitus without complications: Secondary | ICD-10-CM | POA: Diagnosis not present

## 2018-02-25 DIAGNOSIS — R1310 Dysphagia, unspecified: Secondary | ICD-10-CM | POA: Diagnosis not present

## 2018-02-25 DIAGNOSIS — Z9181 History of falling: Secondary | ICD-10-CM | POA: Diagnosis not present

## 2018-02-25 DIAGNOSIS — R634 Abnormal weight loss: Secondary | ICD-10-CM | POA: Diagnosis not present

## 2018-02-25 DIAGNOSIS — S42302S Unspecified fracture of shaft of humerus, left arm, sequela: Secondary | ICD-10-CM | POA: Diagnosis not present

## 2018-02-25 DIAGNOSIS — G20 Parkinson's disease: Secondary | ICD-10-CM | POA: Diagnosis not present

## 2018-02-27 DIAGNOSIS — R634 Abnormal weight loss: Secondary | ICD-10-CM | POA: Diagnosis not present

## 2018-02-27 DIAGNOSIS — Z9181 History of falling: Secondary | ICD-10-CM | POA: Diagnosis not present

## 2018-02-27 DIAGNOSIS — S42302S Unspecified fracture of shaft of humerus, left arm, sequela: Secondary | ICD-10-CM | POA: Diagnosis not present

## 2018-02-27 DIAGNOSIS — G20 Parkinson's disease: Secondary | ICD-10-CM | POA: Diagnosis not present

## 2018-02-27 DIAGNOSIS — K117 Disturbances of salivary secretion: Secondary | ICD-10-CM | POA: Diagnosis not present

## 2018-02-27 DIAGNOSIS — E119 Type 2 diabetes mellitus without complications: Secondary | ICD-10-CM | POA: Diagnosis not present

## 2018-02-27 DIAGNOSIS — R1310 Dysphagia, unspecified: Secondary | ICD-10-CM | POA: Diagnosis not present

## 2018-02-28 DIAGNOSIS — G20 Parkinson's disease: Secondary | ICD-10-CM | POA: Diagnosis not present

## 2018-02-28 DIAGNOSIS — S42302S Unspecified fracture of shaft of humerus, left arm, sequela: Secondary | ICD-10-CM | POA: Diagnosis not present

## 2018-02-28 DIAGNOSIS — R1310 Dysphagia, unspecified: Secondary | ICD-10-CM | POA: Diagnosis not present

## 2018-02-28 DIAGNOSIS — Z9181 History of falling: Secondary | ICD-10-CM | POA: Diagnosis not present

## 2018-02-28 DIAGNOSIS — R634 Abnormal weight loss: Secondary | ICD-10-CM | POA: Diagnosis not present

## 2018-02-28 DIAGNOSIS — E119 Type 2 diabetes mellitus without complications: Secondary | ICD-10-CM | POA: Diagnosis not present

## 2018-03-06 DIAGNOSIS — S42302S Unspecified fracture of shaft of humerus, left arm, sequela: Secondary | ICD-10-CM | POA: Diagnosis not present

## 2018-03-06 DIAGNOSIS — N401 Enlarged prostate with lower urinary tract symptoms: Secondary | ICD-10-CM | POA: Diagnosis not present

## 2018-03-06 DIAGNOSIS — R634 Abnormal weight loss: Secondary | ICD-10-CM | POA: Diagnosis not present

## 2018-03-06 DIAGNOSIS — G20 Parkinson's disease: Secondary | ICD-10-CM | POA: Diagnosis not present

## 2018-03-06 DIAGNOSIS — R471 Dysarthria and anarthria: Secondary | ICD-10-CM | POA: Diagnosis not present

## 2018-03-06 DIAGNOSIS — E119 Type 2 diabetes mellitus without complications: Secondary | ICD-10-CM | POA: Diagnosis not present

## 2018-03-06 DIAGNOSIS — R2689 Other abnormalities of gait and mobility: Secondary | ICD-10-CM | POA: Diagnosis not present

## 2018-03-06 DIAGNOSIS — R1310 Dysphagia, unspecified: Secondary | ICD-10-CM | POA: Diagnosis not present

## 2018-03-06 DIAGNOSIS — R0901 Asphyxia: Secondary | ICD-10-CM | POA: Diagnosis not present

## 2018-03-06 DIAGNOSIS — Z9181 History of falling: Secondary | ICD-10-CM | POA: Diagnosis not present

## 2018-03-07 DIAGNOSIS — R471 Dysarthria and anarthria: Secondary | ICD-10-CM | POA: Diagnosis not present

## 2018-03-07 DIAGNOSIS — R0901 Asphyxia: Secondary | ICD-10-CM | POA: Diagnosis not present

## 2018-03-07 DIAGNOSIS — R2689 Other abnormalities of gait and mobility: Secondary | ICD-10-CM | POA: Diagnosis not present

## 2018-03-07 DIAGNOSIS — R634 Abnormal weight loss: Secondary | ICD-10-CM | POA: Diagnosis not present

## 2018-03-07 DIAGNOSIS — R1310 Dysphagia, unspecified: Secondary | ICD-10-CM | POA: Diagnosis not present

## 2018-03-07 DIAGNOSIS — G20 Parkinson's disease: Secondary | ICD-10-CM | POA: Diagnosis not present

## 2018-03-11 DIAGNOSIS — R1310 Dysphagia, unspecified: Secondary | ICD-10-CM | POA: Diagnosis not present

## 2018-03-11 DIAGNOSIS — R2689 Other abnormalities of gait and mobility: Secondary | ICD-10-CM | POA: Diagnosis not present

## 2018-03-11 DIAGNOSIS — G20 Parkinson's disease: Secondary | ICD-10-CM | POA: Diagnosis not present

## 2018-03-11 DIAGNOSIS — R634 Abnormal weight loss: Secondary | ICD-10-CM | POA: Diagnosis not present

## 2018-03-11 DIAGNOSIS — R471 Dysarthria and anarthria: Secondary | ICD-10-CM | POA: Diagnosis not present

## 2018-03-11 DIAGNOSIS — R0901 Asphyxia: Secondary | ICD-10-CM | POA: Diagnosis not present

## 2018-03-12 DIAGNOSIS — G20 Parkinson's disease: Secondary | ICD-10-CM | POA: Diagnosis not present

## 2018-03-12 DIAGNOSIS — R0901 Asphyxia: Secondary | ICD-10-CM | POA: Diagnosis not present

## 2018-03-12 DIAGNOSIS — R2689 Other abnormalities of gait and mobility: Secondary | ICD-10-CM | POA: Diagnosis not present

## 2018-03-12 DIAGNOSIS — R634 Abnormal weight loss: Secondary | ICD-10-CM | POA: Diagnosis not present

## 2018-03-12 DIAGNOSIS — R1310 Dysphagia, unspecified: Secondary | ICD-10-CM | POA: Diagnosis not present

## 2018-03-12 DIAGNOSIS — R471 Dysarthria and anarthria: Secondary | ICD-10-CM | POA: Diagnosis not present

## 2018-03-14 DIAGNOSIS — R0901 Asphyxia: Secondary | ICD-10-CM | POA: Diagnosis not present

## 2018-03-14 DIAGNOSIS — G20 Parkinson's disease: Secondary | ICD-10-CM | POA: Diagnosis not present

## 2018-03-14 DIAGNOSIS — R2689 Other abnormalities of gait and mobility: Secondary | ICD-10-CM | POA: Diagnosis not present

## 2018-03-14 DIAGNOSIS — R1310 Dysphagia, unspecified: Secondary | ICD-10-CM | POA: Diagnosis not present

## 2018-03-14 DIAGNOSIS — R634 Abnormal weight loss: Secondary | ICD-10-CM | POA: Diagnosis not present

## 2018-03-14 DIAGNOSIS — R471 Dysarthria and anarthria: Secondary | ICD-10-CM | POA: Diagnosis not present

## 2018-03-18 DIAGNOSIS — G20 Parkinson's disease: Secondary | ICD-10-CM | POA: Diagnosis not present

## 2018-03-18 DIAGNOSIS — R1310 Dysphagia, unspecified: Secondary | ICD-10-CM | POA: Diagnosis not present

## 2018-03-18 DIAGNOSIS — R634 Abnormal weight loss: Secondary | ICD-10-CM | POA: Diagnosis not present

## 2018-03-18 DIAGNOSIS — R0901 Asphyxia: Secondary | ICD-10-CM | POA: Diagnosis not present

## 2018-03-18 DIAGNOSIS — R471 Dysarthria and anarthria: Secondary | ICD-10-CM | POA: Diagnosis not present

## 2018-03-18 DIAGNOSIS — R2689 Other abnormalities of gait and mobility: Secondary | ICD-10-CM | POA: Diagnosis not present

## 2018-03-19 DIAGNOSIS — R2689 Other abnormalities of gait and mobility: Secondary | ICD-10-CM | POA: Diagnosis not present

## 2018-03-19 DIAGNOSIS — R471 Dysarthria and anarthria: Secondary | ICD-10-CM | POA: Diagnosis not present

## 2018-03-19 DIAGNOSIS — R0901 Asphyxia: Secondary | ICD-10-CM | POA: Diagnosis not present

## 2018-03-19 DIAGNOSIS — R634 Abnormal weight loss: Secondary | ICD-10-CM | POA: Diagnosis not present

## 2018-03-19 DIAGNOSIS — R1310 Dysphagia, unspecified: Secondary | ICD-10-CM | POA: Diagnosis not present

## 2018-03-19 DIAGNOSIS — G20 Parkinson's disease: Secondary | ICD-10-CM | POA: Diagnosis not present

## 2018-03-21 DIAGNOSIS — R1310 Dysphagia, unspecified: Secondary | ICD-10-CM | POA: Diagnosis not present

## 2018-03-21 DIAGNOSIS — R471 Dysarthria and anarthria: Secondary | ICD-10-CM | POA: Diagnosis not present

## 2018-03-21 DIAGNOSIS — R2689 Other abnormalities of gait and mobility: Secondary | ICD-10-CM | POA: Diagnosis not present

## 2018-03-21 DIAGNOSIS — R0901 Asphyxia: Secondary | ICD-10-CM | POA: Diagnosis not present

## 2018-03-21 DIAGNOSIS — G20 Parkinson's disease: Secondary | ICD-10-CM | POA: Diagnosis not present

## 2018-03-21 DIAGNOSIS — R634 Abnormal weight loss: Secondary | ICD-10-CM | POA: Diagnosis not present

## 2018-03-25 DIAGNOSIS — G20 Parkinson's disease: Secondary | ICD-10-CM | POA: Diagnosis not present

## 2018-03-25 DIAGNOSIS — R1310 Dysphagia, unspecified: Secondary | ICD-10-CM | POA: Diagnosis not present

## 2018-03-25 DIAGNOSIS — R471 Dysarthria and anarthria: Secondary | ICD-10-CM | POA: Diagnosis not present

## 2018-03-25 DIAGNOSIS — R634 Abnormal weight loss: Secondary | ICD-10-CM | POA: Diagnosis not present

## 2018-03-25 DIAGNOSIS — R0901 Asphyxia: Secondary | ICD-10-CM | POA: Diagnosis not present

## 2018-03-25 DIAGNOSIS — R2689 Other abnormalities of gait and mobility: Secondary | ICD-10-CM | POA: Diagnosis not present

## 2018-03-28 DIAGNOSIS — R2689 Other abnormalities of gait and mobility: Secondary | ICD-10-CM | POA: Diagnosis not present

## 2018-03-28 DIAGNOSIS — R634 Abnormal weight loss: Secondary | ICD-10-CM | POA: Diagnosis not present

## 2018-03-28 DIAGNOSIS — R1310 Dysphagia, unspecified: Secondary | ICD-10-CM | POA: Diagnosis not present

## 2018-03-28 DIAGNOSIS — R471 Dysarthria and anarthria: Secondary | ICD-10-CM | POA: Diagnosis not present

## 2018-03-28 DIAGNOSIS — R0901 Asphyxia: Secondary | ICD-10-CM | POA: Diagnosis not present

## 2018-03-28 DIAGNOSIS — G20 Parkinson's disease: Secondary | ICD-10-CM | POA: Diagnosis not present

## 2018-04-01 DIAGNOSIS — R1310 Dysphagia, unspecified: Secondary | ICD-10-CM | POA: Diagnosis not present

## 2018-04-01 DIAGNOSIS — G20 Parkinson's disease: Secondary | ICD-10-CM | POA: Diagnosis not present

## 2018-04-01 DIAGNOSIS — R634 Abnormal weight loss: Secondary | ICD-10-CM | POA: Diagnosis not present

## 2018-04-01 DIAGNOSIS — R0901 Asphyxia: Secondary | ICD-10-CM | POA: Diagnosis not present

## 2018-04-01 DIAGNOSIS — R471 Dysarthria and anarthria: Secondary | ICD-10-CM | POA: Diagnosis not present

## 2018-04-01 DIAGNOSIS — R2689 Other abnormalities of gait and mobility: Secondary | ICD-10-CM | POA: Diagnosis not present

## 2018-04-03 DIAGNOSIS — S42302S Unspecified fracture of shaft of humerus, left arm, sequela: Secondary | ICD-10-CM | POA: Diagnosis not present

## 2018-04-03 DIAGNOSIS — R471 Dysarthria and anarthria: Secondary | ICD-10-CM | POA: Diagnosis not present

## 2018-04-03 DIAGNOSIS — R634 Abnormal weight loss: Secondary | ICD-10-CM | POA: Diagnosis not present

## 2018-04-03 DIAGNOSIS — N401 Enlarged prostate with lower urinary tract symptoms: Secondary | ICD-10-CM | POA: Diagnosis not present

## 2018-04-03 DIAGNOSIS — Z9181 History of falling: Secondary | ICD-10-CM | POA: Diagnosis not present

## 2018-04-03 DIAGNOSIS — R0901 Asphyxia: Secondary | ICD-10-CM | POA: Diagnosis not present

## 2018-04-03 DIAGNOSIS — R1310 Dysphagia, unspecified: Secondary | ICD-10-CM | POA: Diagnosis not present

## 2018-04-03 DIAGNOSIS — R2689 Other abnormalities of gait and mobility: Secondary | ICD-10-CM | POA: Diagnosis not present

## 2018-04-03 DIAGNOSIS — E119 Type 2 diabetes mellitus without complications: Secondary | ICD-10-CM | POA: Diagnosis not present

## 2018-04-03 DIAGNOSIS — G20 Parkinson's disease: Secondary | ICD-10-CM | POA: Diagnosis not present

## 2018-04-11 DIAGNOSIS — R634 Abnormal weight loss: Secondary | ICD-10-CM | POA: Diagnosis not present

## 2018-04-11 DIAGNOSIS — R2689 Other abnormalities of gait and mobility: Secondary | ICD-10-CM | POA: Diagnosis not present

## 2018-04-11 DIAGNOSIS — R471 Dysarthria and anarthria: Secondary | ICD-10-CM | POA: Diagnosis not present

## 2018-04-11 DIAGNOSIS — R0901 Asphyxia: Secondary | ICD-10-CM | POA: Diagnosis not present

## 2018-04-11 DIAGNOSIS — R1310 Dysphagia, unspecified: Secondary | ICD-10-CM | POA: Diagnosis not present

## 2018-04-11 DIAGNOSIS — G20 Parkinson's disease: Secondary | ICD-10-CM | POA: Diagnosis not present

## 2018-04-23 DIAGNOSIS — R0901 Asphyxia: Secondary | ICD-10-CM | POA: Diagnosis not present

## 2018-04-23 DIAGNOSIS — G20 Parkinson's disease: Secondary | ICD-10-CM | POA: Diagnosis not present

## 2018-04-23 DIAGNOSIS — R2689 Other abnormalities of gait and mobility: Secondary | ICD-10-CM | POA: Diagnosis not present

## 2018-04-23 DIAGNOSIS — R471 Dysarthria and anarthria: Secondary | ICD-10-CM | POA: Diagnosis not present

## 2018-04-23 DIAGNOSIS — R1310 Dysphagia, unspecified: Secondary | ICD-10-CM | POA: Diagnosis not present

## 2018-04-23 DIAGNOSIS — R634 Abnormal weight loss: Secondary | ICD-10-CM | POA: Diagnosis not present

## 2018-04-25 DIAGNOSIS — R0901 Asphyxia: Secondary | ICD-10-CM | POA: Diagnosis not present

## 2018-04-25 DIAGNOSIS — G20 Parkinson's disease: Secondary | ICD-10-CM | POA: Diagnosis not present

## 2018-04-25 DIAGNOSIS — R1310 Dysphagia, unspecified: Secondary | ICD-10-CM | POA: Diagnosis not present

## 2018-04-25 DIAGNOSIS — R2689 Other abnormalities of gait and mobility: Secondary | ICD-10-CM | POA: Diagnosis not present

## 2018-04-25 DIAGNOSIS — R634 Abnormal weight loss: Secondary | ICD-10-CM | POA: Diagnosis not present

## 2018-04-25 DIAGNOSIS — R471 Dysarthria and anarthria: Secondary | ICD-10-CM | POA: Diagnosis not present

## 2018-05-03 DIAGNOSIS — S42302S Unspecified fracture of shaft of humerus, left arm, sequela: Secondary | ICD-10-CM | POA: Diagnosis not present

## 2018-05-03 DIAGNOSIS — Z9181 History of falling: Secondary | ICD-10-CM | POA: Diagnosis not present

## 2018-05-03 DIAGNOSIS — E119 Type 2 diabetes mellitus without complications: Secondary | ICD-10-CM | POA: Diagnosis not present

## 2018-05-03 DIAGNOSIS — R634 Abnormal weight loss: Secondary | ICD-10-CM | POA: Diagnosis not present

## 2018-05-03 DIAGNOSIS — G20 Parkinson's disease: Secondary | ICD-10-CM | POA: Diagnosis not present

## 2018-05-03 DIAGNOSIS — R1310 Dysphagia, unspecified: Secondary | ICD-10-CM | POA: Diagnosis not present

## 2018-05-03 DIAGNOSIS — N401 Enlarged prostate with lower urinary tract symptoms: Secondary | ICD-10-CM | POA: Diagnosis not present

## 2018-05-03 DIAGNOSIS — R471 Dysarthria and anarthria: Secondary | ICD-10-CM | POA: Diagnosis not present

## 2018-05-03 DIAGNOSIS — R2689 Other abnormalities of gait and mobility: Secondary | ICD-10-CM | POA: Diagnosis not present

## 2018-05-03 DIAGNOSIS — R0901 Asphyxia: Secondary | ICD-10-CM | POA: Diagnosis not present

## 2018-05-16 DIAGNOSIS — R471 Dysarthria and anarthria: Secondary | ICD-10-CM | POA: Diagnosis not present

## 2018-05-16 DIAGNOSIS — R2689 Other abnormalities of gait and mobility: Secondary | ICD-10-CM | POA: Diagnosis not present

## 2018-05-16 DIAGNOSIS — R634 Abnormal weight loss: Secondary | ICD-10-CM | POA: Diagnosis not present

## 2018-05-16 DIAGNOSIS — R1310 Dysphagia, unspecified: Secondary | ICD-10-CM | POA: Diagnosis not present

## 2018-05-16 DIAGNOSIS — G20 Parkinson's disease: Secondary | ICD-10-CM | POA: Diagnosis not present

## 2018-05-16 DIAGNOSIS — R0901 Asphyxia: Secondary | ICD-10-CM | POA: Diagnosis not present

## 2018-06-03 DIAGNOSIS — R471 Dysarthria and anarthria: Secondary | ICD-10-CM | POA: Diagnosis not present

## 2018-06-03 DIAGNOSIS — R2689 Other abnormalities of gait and mobility: Secondary | ICD-10-CM | POA: Diagnosis not present

## 2018-06-03 DIAGNOSIS — Z9181 History of falling: Secondary | ICD-10-CM | POA: Diagnosis not present

## 2018-06-03 DIAGNOSIS — G20 Parkinson's disease: Secondary | ICD-10-CM | POA: Diagnosis not present

## 2018-06-03 DIAGNOSIS — R1310 Dysphagia, unspecified: Secondary | ICD-10-CM | POA: Diagnosis not present

## 2018-06-03 DIAGNOSIS — S42302S Unspecified fracture of shaft of humerus, left arm, sequela: Secondary | ICD-10-CM | POA: Diagnosis not present

## 2018-06-03 DIAGNOSIS — N401 Enlarged prostate with lower urinary tract symptoms: Secondary | ICD-10-CM | POA: Diagnosis not present

## 2018-06-03 DIAGNOSIS — E119 Type 2 diabetes mellitus without complications: Secondary | ICD-10-CM | POA: Diagnosis not present

## 2018-06-03 DIAGNOSIS — R634 Abnormal weight loss: Secondary | ICD-10-CM | POA: Diagnosis not present

## 2018-06-03 DIAGNOSIS — R0901 Asphyxia: Secondary | ICD-10-CM | POA: Diagnosis not present

## 2018-06-06 DIAGNOSIS — R2689 Other abnormalities of gait and mobility: Secondary | ICD-10-CM | POA: Diagnosis not present

## 2018-06-06 DIAGNOSIS — R0901 Asphyxia: Secondary | ICD-10-CM | POA: Diagnosis not present

## 2018-06-06 DIAGNOSIS — R471 Dysarthria and anarthria: Secondary | ICD-10-CM | POA: Diagnosis not present

## 2018-06-06 DIAGNOSIS — R634 Abnormal weight loss: Secondary | ICD-10-CM | POA: Diagnosis not present

## 2018-06-06 DIAGNOSIS — R1310 Dysphagia, unspecified: Secondary | ICD-10-CM | POA: Diagnosis not present

## 2018-06-06 DIAGNOSIS — G20 Parkinson's disease: Secondary | ICD-10-CM | POA: Diagnosis not present

## 2018-06-20 DIAGNOSIS — R2689 Other abnormalities of gait and mobility: Secondary | ICD-10-CM | POA: Diagnosis not present

## 2018-06-20 DIAGNOSIS — R471 Dysarthria and anarthria: Secondary | ICD-10-CM | POA: Diagnosis not present

## 2018-06-20 DIAGNOSIS — H903 Sensorineural hearing loss, bilateral: Secondary | ICD-10-CM | POA: Diagnosis not present

## 2018-06-20 DIAGNOSIS — R1310 Dysphagia, unspecified: Secondary | ICD-10-CM | POA: Diagnosis not present

## 2018-06-20 DIAGNOSIS — G20 Parkinson's disease: Secondary | ICD-10-CM | POA: Diagnosis not present

## 2018-06-20 DIAGNOSIS — R634 Abnormal weight loss: Secondary | ICD-10-CM | POA: Diagnosis not present

## 2018-06-20 DIAGNOSIS — R0901 Asphyxia: Secondary | ICD-10-CM | POA: Diagnosis not present

## 2018-07-01 DIAGNOSIS — R0901 Asphyxia: Secondary | ICD-10-CM | POA: Diagnosis not present

## 2018-07-01 DIAGNOSIS — R634 Abnormal weight loss: Secondary | ICD-10-CM | POA: Diagnosis not present

## 2018-07-01 DIAGNOSIS — G20 Parkinson's disease: Secondary | ICD-10-CM | POA: Diagnosis not present

## 2018-07-01 DIAGNOSIS — R2689 Other abnormalities of gait and mobility: Secondary | ICD-10-CM | POA: Diagnosis not present

## 2018-07-01 DIAGNOSIS — R1310 Dysphagia, unspecified: Secondary | ICD-10-CM | POA: Diagnosis not present

## 2018-07-01 DIAGNOSIS — R471 Dysarthria and anarthria: Secondary | ICD-10-CM | POA: Diagnosis not present

## 2018-07-03 DIAGNOSIS — S42302S Unspecified fracture of shaft of humerus, left arm, sequela: Secondary | ICD-10-CM | POA: Diagnosis not present

## 2018-07-03 DIAGNOSIS — R471 Dysarthria and anarthria: Secondary | ICD-10-CM | POA: Diagnosis not present

## 2018-07-03 DIAGNOSIS — R2689 Other abnormalities of gait and mobility: Secondary | ICD-10-CM | POA: Diagnosis not present

## 2018-07-03 DIAGNOSIS — R634 Abnormal weight loss: Secondary | ICD-10-CM | POA: Diagnosis not present

## 2018-07-03 DIAGNOSIS — G20 Parkinson's disease: Secondary | ICD-10-CM | POA: Diagnosis not present

## 2018-07-03 DIAGNOSIS — R0901 Asphyxia: Secondary | ICD-10-CM | POA: Diagnosis not present

## 2018-07-03 DIAGNOSIS — E119 Type 2 diabetes mellitus without complications: Secondary | ICD-10-CM | POA: Diagnosis not present

## 2018-07-03 DIAGNOSIS — N401 Enlarged prostate with lower urinary tract symptoms: Secondary | ICD-10-CM | POA: Diagnosis not present

## 2018-07-03 DIAGNOSIS — Z9181 History of falling: Secondary | ICD-10-CM | POA: Diagnosis not present

## 2018-07-03 DIAGNOSIS — R1310 Dysphagia, unspecified: Secondary | ICD-10-CM | POA: Diagnosis not present

## 2018-07-18 DIAGNOSIS — R2689 Other abnormalities of gait and mobility: Secondary | ICD-10-CM | POA: Diagnosis not present

## 2018-07-18 DIAGNOSIS — R634 Abnormal weight loss: Secondary | ICD-10-CM | POA: Diagnosis not present

## 2018-07-18 DIAGNOSIS — R0901 Asphyxia: Secondary | ICD-10-CM | POA: Diagnosis not present

## 2018-07-18 DIAGNOSIS — R1310 Dysphagia, unspecified: Secondary | ICD-10-CM | POA: Diagnosis not present

## 2018-07-18 DIAGNOSIS — G20 Parkinson's disease: Secondary | ICD-10-CM | POA: Diagnosis not present

## 2018-07-18 DIAGNOSIS — R471 Dysarthria and anarthria: Secondary | ICD-10-CM | POA: Diagnosis not present

## 2018-07-24 DIAGNOSIS — R0901 Asphyxia: Secondary | ICD-10-CM | POA: Diagnosis not present

## 2018-07-24 DIAGNOSIS — G20 Parkinson's disease: Secondary | ICD-10-CM | POA: Diagnosis not present

## 2018-07-24 DIAGNOSIS — R2689 Other abnormalities of gait and mobility: Secondary | ICD-10-CM | POA: Diagnosis not present

## 2018-07-24 DIAGNOSIS — R1310 Dysphagia, unspecified: Secondary | ICD-10-CM | POA: Diagnosis not present

## 2018-07-24 DIAGNOSIS — R634 Abnormal weight loss: Secondary | ICD-10-CM | POA: Diagnosis not present

## 2018-07-24 DIAGNOSIS — K117 Disturbances of salivary secretion: Secondary | ICD-10-CM | POA: Diagnosis not present

## 2018-07-24 DIAGNOSIS — R471 Dysarthria and anarthria: Secondary | ICD-10-CM | POA: Diagnosis not present

## 2018-07-25 DIAGNOSIS — R471 Dysarthria and anarthria: Secondary | ICD-10-CM | POA: Diagnosis not present

## 2018-07-25 DIAGNOSIS — R634 Abnormal weight loss: Secondary | ICD-10-CM | POA: Diagnosis not present

## 2018-07-25 DIAGNOSIS — R0901 Asphyxia: Secondary | ICD-10-CM | POA: Diagnosis not present

## 2018-07-25 DIAGNOSIS — R1310 Dysphagia, unspecified: Secondary | ICD-10-CM | POA: Diagnosis not present

## 2018-07-25 DIAGNOSIS — G20 Parkinson's disease: Secondary | ICD-10-CM | POA: Diagnosis not present

## 2018-07-25 DIAGNOSIS — R2689 Other abnormalities of gait and mobility: Secondary | ICD-10-CM | POA: Diagnosis not present

## 2018-08-01 DIAGNOSIS — R1310 Dysphagia, unspecified: Secondary | ICD-10-CM | POA: Diagnosis not present

## 2018-08-01 DIAGNOSIS — R634 Abnormal weight loss: Secondary | ICD-10-CM | POA: Diagnosis not present

## 2018-08-01 DIAGNOSIS — G20 Parkinson's disease: Secondary | ICD-10-CM | POA: Diagnosis not present

## 2018-08-01 DIAGNOSIS — R471 Dysarthria and anarthria: Secondary | ICD-10-CM | POA: Diagnosis not present

## 2018-08-01 DIAGNOSIS — R2689 Other abnormalities of gait and mobility: Secondary | ICD-10-CM | POA: Diagnosis not present

## 2018-08-01 DIAGNOSIS — R0901 Asphyxia: Secondary | ICD-10-CM | POA: Diagnosis not present

## 2018-08-02 ENCOUNTER — Other Ambulatory Visit: Payer: Self-pay

## 2018-08-03 DIAGNOSIS — R634 Abnormal weight loss: Secondary | ICD-10-CM | POA: Diagnosis not present

## 2018-08-03 DIAGNOSIS — Z9181 History of falling: Secondary | ICD-10-CM | POA: Diagnosis not present

## 2018-08-03 DIAGNOSIS — R1310 Dysphagia, unspecified: Secondary | ICD-10-CM | POA: Diagnosis not present

## 2018-08-03 DIAGNOSIS — R2689 Other abnormalities of gait and mobility: Secondary | ICD-10-CM | POA: Diagnosis not present

## 2018-08-03 DIAGNOSIS — R471 Dysarthria and anarthria: Secondary | ICD-10-CM | POA: Diagnosis not present

## 2018-08-03 DIAGNOSIS — N401 Enlarged prostate with lower urinary tract symptoms: Secondary | ICD-10-CM | POA: Diagnosis not present

## 2018-08-03 DIAGNOSIS — G20 Parkinson's disease: Secondary | ICD-10-CM | POA: Diagnosis not present

## 2018-08-03 DIAGNOSIS — R0901 Asphyxia: Secondary | ICD-10-CM | POA: Diagnosis not present

## 2018-08-03 DIAGNOSIS — E119 Type 2 diabetes mellitus without complications: Secondary | ICD-10-CM | POA: Diagnosis not present

## 2018-08-03 DIAGNOSIS — S42302S Unspecified fracture of shaft of humerus, left arm, sequela: Secondary | ICD-10-CM | POA: Diagnosis not present

## 2018-08-07 DIAGNOSIS — R0901 Asphyxia: Secondary | ICD-10-CM | POA: Diagnosis not present

## 2018-08-07 DIAGNOSIS — R471 Dysarthria and anarthria: Secondary | ICD-10-CM | POA: Diagnosis not present

## 2018-08-07 DIAGNOSIS — G20 Parkinson's disease: Secondary | ICD-10-CM | POA: Diagnosis not present

## 2018-08-07 DIAGNOSIS — R2689 Other abnormalities of gait and mobility: Secondary | ICD-10-CM | POA: Diagnosis not present

## 2018-08-07 DIAGNOSIS — R634 Abnormal weight loss: Secondary | ICD-10-CM | POA: Diagnosis not present

## 2018-08-07 DIAGNOSIS — R1310 Dysphagia, unspecified: Secondary | ICD-10-CM | POA: Diagnosis not present

## 2018-08-09 DIAGNOSIS — R0901 Asphyxia: Secondary | ICD-10-CM | POA: Diagnosis not present

## 2018-08-09 DIAGNOSIS — R2689 Other abnormalities of gait and mobility: Secondary | ICD-10-CM | POA: Diagnosis not present

## 2018-08-09 DIAGNOSIS — R471 Dysarthria and anarthria: Secondary | ICD-10-CM | POA: Diagnosis not present

## 2018-08-09 DIAGNOSIS — R634 Abnormal weight loss: Secondary | ICD-10-CM | POA: Diagnosis not present

## 2018-08-09 DIAGNOSIS — G20 Parkinson's disease: Secondary | ICD-10-CM | POA: Diagnosis not present

## 2018-08-09 DIAGNOSIS — R1310 Dysphagia, unspecified: Secondary | ICD-10-CM | POA: Diagnosis not present

## 2018-08-12 DIAGNOSIS — G20 Parkinson's disease: Secondary | ICD-10-CM | POA: Diagnosis not present

## 2018-08-12 DIAGNOSIS — R1310 Dysphagia, unspecified: Secondary | ICD-10-CM | POA: Diagnosis not present

## 2018-08-12 DIAGNOSIS — R0901 Asphyxia: Secondary | ICD-10-CM | POA: Diagnosis not present

## 2018-08-12 DIAGNOSIS — R471 Dysarthria and anarthria: Secondary | ICD-10-CM | POA: Diagnosis not present

## 2018-08-12 DIAGNOSIS — R2689 Other abnormalities of gait and mobility: Secondary | ICD-10-CM | POA: Diagnosis not present

## 2018-08-12 DIAGNOSIS — R634 Abnormal weight loss: Secondary | ICD-10-CM | POA: Diagnosis not present

## 2018-08-15 DIAGNOSIS — R2689 Other abnormalities of gait and mobility: Secondary | ICD-10-CM | POA: Diagnosis not present

## 2018-08-15 DIAGNOSIS — G20 Parkinson's disease: Secondary | ICD-10-CM | POA: Diagnosis not present

## 2018-08-15 DIAGNOSIS — R634 Abnormal weight loss: Secondary | ICD-10-CM | POA: Diagnosis not present

## 2018-08-15 DIAGNOSIS — R1310 Dysphagia, unspecified: Secondary | ICD-10-CM | POA: Diagnosis not present

## 2018-08-15 DIAGNOSIS — R0901 Asphyxia: Secondary | ICD-10-CM | POA: Diagnosis not present

## 2018-08-15 DIAGNOSIS — R471 Dysarthria and anarthria: Secondary | ICD-10-CM | POA: Diagnosis not present

## 2018-08-16 DIAGNOSIS — R0901 Asphyxia: Secondary | ICD-10-CM | POA: Diagnosis not present

## 2018-08-16 DIAGNOSIS — R471 Dysarthria and anarthria: Secondary | ICD-10-CM | POA: Diagnosis not present

## 2018-08-16 DIAGNOSIS — R2689 Other abnormalities of gait and mobility: Secondary | ICD-10-CM | POA: Diagnosis not present

## 2018-08-16 DIAGNOSIS — R1310 Dysphagia, unspecified: Secondary | ICD-10-CM | POA: Diagnosis not present

## 2018-08-16 DIAGNOSIS — R634 Abnormal weight loss: Secondary | ICD-10-CM | POA: Diagnosis not present

## 2018-08-16 DIAGNOSIS — G20 Parkinson's disease: Secondary | ICD-10-CM | POA: Diagnosis not present

## 2018-08-19 DIAGNOSIS — R1310 Dysphagia, unspecified: Secondary | ICD-10-CM | POA: Diagnosis not present

## 2018-08-19 DIAGNOSIS — R471 Dysarthria and anarthria: Secondary | ICD-10-CM | POA: Diagnosis not present

## 2018-08-19 DIAGNOSIS — G20 Parkinson's disease: Secondary | ICD-10-CM | POA: Diagnosis not present

## 2018-08-19 DIAGNOSIS — R0901 Asphyxia: Secondary | ICD-10-CM | POA: Diagnosis not present

## 2018-08-19 DIAGNOSIS — R2689 Other abnormalities of gait and mobility: Secondary | ICD-10-CM | POA: Diagnosis not present

## 2018-08-19 DIAGNOSIS — R634 Abnormal weight loss: Secondary | ICD-10-CM | POA: Diagnosis not present

## 2018-08-21 DIAGNOSIS — R0901 Asphyxia: Secondary | ICD-10-CM | POA: Diagnosis not present

## 2018-08-21 DIAGNOSIS — R1310 Dysphagia, unspecified: Secondary | ICD-10-CM | POA: Diagnosis not present

## 2018-08-21 DIAGNOSIS — R2689 Other abnormalities of gait and mobility: Secondary | ICD-10-CM | POA: Diagnosis not present

## 2018-08-21 DIAGNOSIS — R471 Dysarthria and anarthria: Secondary | ICD-10-CM | POA: Diagnosis not present

## 2018-08-21 DIAGNOSIS — G20 Parkinson's disease: Secondary | ICD-10-CM | POA: Diagnosis not present

## 2018-08-21 DIAGNOSIS — R634 Abnormal weight loss: Secondary | ICD-10-CM | POA: Diagnosis not present

## 2018-08-23 DIAGNOSIS — G20 Parkinson's disease: Secondary | ICD-10-CM | POA: Diagnosis not present

## 2018-08-23 DIAGNOSIS — R1310 Dysphagia, unspecified: Secondary | ICD-10-CM | POA: Diagnosis not present

## 2018-08-23 DIAGNOSIS — R0901 Asphyxia: Secondary | ICD-10-CM | POA: Diagnosis not present

## 2018-08-23 DIAGNOSIS — R634 Abnormal weight loss: Secondary | ICD-10-CM | POA: Diagnosis not present

## 2018-08-23 DIAGNOSIS — R2689 Other abnormalities of gait and mobility: Secondary | ICD-10-CM | POA: Diagnosis not present

## 2018-08-23 DIAGNOSIS — R471 Dysarthria and anarthria: Secondary | ICD-10-CM | POA: Diagnosis not present

## 2018-08-26 DIAGNOSIS — R1310 Dysphagia, unspecified: Secondary | ICD-10-CM | POA: Diagnosis not present

## 2018-08-26 DIAGNOSIS — G20 Parkinson's disease: Secondary | ICD-10-CM | POA: Diagnosis not present

## 2018-08-26 DIAGNOSIS — R0901 Asphyxia: Secondary | ICD-10-CM | POA: Diagnosis not present

## 2018-08-26 DIAGNOSIS — R2689 Other abnormalities of gait and mobility: Secondary | ICD-10-CM | POA: Diagnosis not present

## 2018-08-26 DIAGNOSIS — R634 Abnormal weight loss: Secondary | ICD-10-CM | POA: Diagnosis not present

## 2018-08-26 DIAGNOSIS — R471 Dysarthria and anarthria: Secondary | ICD-10-CM | POA: Diagnosis not present

## 2018-08-28 DIAGNOSIS — R1310 Dysphagia, unspecified: Secondary | ICD-10-CM | POA: Diagnosis not present

## 2018-08-28 DIAGNOSIS — R2689 Other abnormalities of gait and mobility: Secondary | ICD-10-CM | POA: Diagnosis not present

## 2018-08-28 DIAGNOSIS — G20 Parkinson's disease: Secondary | ICD-10-CM | POA: Diagnosis not present

## 2018-08-28 DIAGNOSIS — R634 Abnormal weight loss: Secondary | ICD-10-CM | POA: Diagnosis not present

## 2018-08-28 DIAGNOSIS — R471 Dysarthria and anarthria: Secondary | ICD-10-CM | POA: Diagnosis not present

## 2018-08-28 DIAGNOSIS — R0901 Asphyxia: Secondary | ICD-10-CM | POA: Diagnosis not present

## 2018-08-29 DIAGNOSIS — R471 Dysarthria and anarthria: Secondary | ICD-10-CM | POA: Diagnosis not present

## 2018-08-29 DIAGNOSIS — R634 Abnormal weight loss: Secondary | ICD-10-CM | POA: Diagnosis not present

## 2018-08-29 DIAGNOSIS — R1310 Dysphagia, unspecified: Secondary | ICD-10-CM | POA: Diagnosis not present

## 2018-08-29 DIAGNOSIS — R0901 Asphyxia: Secondary | ICD-10-CM | POA: Diagnosis not present

## 2018-08-29 DIAGNOSIS — G20 Parkinson's disease: Secondary | ICD-10-CM | POA: Diagnosis not present

## 2018-08-29 DIAGNOSIS — R2689 Other abnormalities of gait and mobility: Secondary | ICD-10-CM | POA: Diagnosis not present

## 2018-08-30 DIAGNOSIS — G20 Parkinson's disease: Secondary | ICD-10-CM | POA: Diagnosis not present

## 2018-08-30 DIAGNOSIS — R0901 Asphyxia: Secondary | ICD-10-CM | POA: Diagnosis not present

## 2018-08-30 DIAGNOSIS — R2689 Other abnormalities of gait and mobility: Secondary | ICD-10-CM | POA: Diagnosis not present

## 2018-08-30 DIAGNOSIS — R634 Abnormal weight loss: Secondary | ICD-10-CM | POA: Diagnosis not present

## 2018-08-30 DIAGNOSIS — R1310 Dysphagia, unspecified: Secondary | ICD-10-CM | POA: Diagnosis not present

## 2018-08-30 DIAGNOSIS — R471 Dysarthria and anarthria: Secondary | ICD-10-CM | POA: Diagnosis not present

## 2018-09-02 DIAGNOSIS — R0901 Asphyxia: Secondary | ICD-10-CM | POA: Diagnosis not present

## 2018-09-02 DIAGNOSIS — R634 Abnormal weight loss: Secondary | ICD-10-CM | POA: Diagnosis not present

## 2018-09-02 DIAGNOSIS — R2689 Other abnormalities of gait and mobility: Secondary | ICD-10-CM | POA: Diagnosis not present

## 2018-09-02 DIAGNOSIS — R1310 Dysphagia, unspecified: Secondary | ICD-10-CM | POA: Diagnosis not present

## 2018-09-02 DIAGNOSIS — G20 Parkinson's disease: Secondary | ICD-10-CM | POA: Diagnosis not present

## 2018-09-02 DIAGNOSIS — R471 Dysarthria and anarthria: Secondary | ICD-10-CM | POA: Diagnosis not present

## 2018-09-03 DIAGNOSIS — Z9181 History of falling: Secondary | ICD-10-CM | POA: Diagnosis not present

## 2018-09-03 DIAGNOSIS — S42302S Unspecified fracture of shaft of humerus, left arm, sequela: Secondary | ICD-10-CM | POA: Diagnosis not present

## 2018-09-03 DIAGNOSIS — R1310 Dysphagia, unspecified: Secondary | ICD-10-CM | POA: Diagnosis not present

## 2018-09-03 DIAGNOSIS — R0901 Asphyxia: Secondary | ICD-10-CM | POA: Diagnosis not present

## 2018-09-03 DIAGNOSIS — E119 Type 2 diabetes mellitus without complications: Secondary | ICD-10-CM | POA: Diagnosis not present

## 2018-09-03 DIAGNOSIS — G20 Parkinson's disease: Secondary | ICD-10-CM | POA: Diagnosis not present

## 2018-09-03 DIAGNOSIS — R634 Abnormal weight loss: Secondary | ICD-10-CM | POA: Diagnosis not present

## 2018-09-03 DIAGNOSIS — R471 Dysarthria and anarthria: Secondary | ICD-10-CM | POA: Diagnosis not present

## 2018-09-03 DIAGNOSIS — N401 Enlarged prostate with lower urinary tract symptoms: Secondary | ICD-10-CM | POA: Diagnosis not present

## 2018-09-03 DIAGNOSIS — R2689 Other abnormalities of gait and mobility: Secondary | ICD-10-CM | POA: Diagnosis not present

## 2018-09-04 DIAGNOSIS — R0901 Asphyxia: Secondary | ICD-10-CM | POA: Diagnosis not present

## 2018-09-04 DIAGNOSIS — R471 Dysarthria and anarthria: Secondary | ICD-10-CM | POA: Diagnosis not present

## 2018-09-04 DIAGNOSIS — R634 Abnormal weight loss: Secondary | ICD-10-CM | POA: Diagnosis not present

## 2018-09-04 DIAGNOSIS — R1310 Dysphagia, unspecified: Secondary | ICD-10-CM | POA: Diagnosis not present

## 2018-09-04 DIAGNOSIS — G20 Parkinson's disease: Secondary | ICD-10-CM | POA: Diagnosis not present

## 2018-09-04 DIAGNOSIS — R2689 Other abnormalities of gait and mobility: Secondary | ICD-10-CM | POA: Diagnosis not present

## 2018-09-06 DIAGNOSIS — R471 Dysarthria and anarthria: Secondary | ICD-10-CM | POA: Diagnosis not present

## 2018-09-06 DIAGNOSIS — G20 Parkinson's disease: Secondary | ICD-10-CM | POA: Diagnosis not present

## 2018-09-06 DIAGNOSIS — R1310 Dysphagia, unspecified: Secondary | ICD-10-CM | POA: Diagnosis not present

## 2018-09-06 DIAGNOSIS — R634 Abnormal weight loss: Secondary | ICD-10-CM | POA: Diagnosis not present

## 2018-09-06 DIAGNOSIS — R2689 Other abnormalities of gait and mobility: Secondary | ICD-10-CM | POA: Diagnosis not present

## 2018-09-06 DIAGNOSIS — R0901 Asphyxia: Secondary | ICD-10-CM | POA: Diagnosis not present

## 2018-09-09 DIAGNOSIS — R0901 Asphyxia: Secondary | ICD-10-CM | POA: Diagnosis not present

## 2018-09-09 DIAGNOSIS — R634 Abnormal weight loss: Secondary | ICD-10-CM | POA: Diagnosis not present

## 2018-09-09 DIAGNOSIS — R1310 Dysphagia, unspecified: Secondary | ICD-10-CM | POA: Diagnosis not present

## 2018-09-09 DIAGNOSIS — G20 Parkinson's disease: Secondary | ICD-10-CM | POA: Diagnosis not present

## 2018-09-09 DIAGNOSIS — R471 Dysarthria and anarthria: Secondary | ICD-10-CM | POA: Diagnosis not present

## 2018-09-09 DIAGNOSIS — R2689 Other abnormalities of gait and mobility: Secondary | ICD-10-CM | POA: Diagnosis not present

## 2018-09-10 DIAGNOSIS — G20 Parkinson's disease: Secondary | ICD-10-CM | POA: Diagnosis not present

## 2018-09-10 DIAGNOSIS — R531 Weakness: Secondary | ICD-10-CM | POA: Diagnosis not present

## 2018-09-10 DIAGNOSIS — R1310 Dysphagia, unspecified: Secondary | ICD-10-CM | POA: Diagnosis not present

## 2018-09-10 DIAGNOSIS — N400 Enlarged prostate without lower urinary tract symptoms: Secondary | ICD-10-CM | POA: Diagnosis not present

## 2018-09-11 DIAGNOSIS — R1310 Dysphagia, unspecified: Secondary | ICD-10-CM | POA: Diagnosis not present

## 2018-09-11 DIAGNOSIS — R531 Weakness: Secondary | ICD-10-CM | POA: Diagnosis not present

## 2018-09-11 DIAGNOSIS — G20 Parkinson's disease: Secondary | ICD-10-CM | POA: Diagnosis not present

## 2018-09-11 DIAGNOSIS — N400 Enlarged prostate without lower urinary tract symptoms: Secondary | ICD-10-CM | POA: Diagnosis not present

## 2018-09-12 DIAGNOSIS — G20 Parkinson's disease: Secondary | ICD-10-CM | POA: Diagnosis not present

## 2018-09-12 DIAGNOSIS — R1310 Dysphagia, unspecified: Secondary | ICD-10-CM | POA: Diagnosis not present

## 2018-09-12 DIAGNOSIS — R531 Weakness: Secondary | ICD-10-CM | POA: Diagnosis not present

## 2018-09-12 DIAGNOSIS — N400 Enlarged prostate without lower urinary tract symptoms: Secondary | ICD-10-CM | POA: Diagnosis not present

## 2018-09-13 DIAGNOSIS — R1310 Dysphagia, unspecified: Secondary | ICD-10-CM | POA: Diagnosis not present

## 2018-09-13 DIAGNOSIS — N400 Enlarged prostate without lower urinary tract symptoms: Secondary | ICD-10-CM | POA: Diagnosis not present

## 2018-09-13 DIAGNOSIS — R531 Weakness: Secondary | ICD-10-CM | POA: Diagnosis not present

## 2018-09-13 DIAGNOSIS — G20 Parkinson's disease: Secondary | ICD-10-CM | POA: Diagnosis not present

## 2018-09-16 DIAGNOSIS — R1310 Dysphagia, unspecified: Secondary | ICD-10-CM | POA: Diagnosis not present

## 2018-09-16 DIAGNOSIS — N400 Enlarged prostate without lower urinary tract symptoms: Secondary | ICD-10-CM | POA: Diagnosis not present

## 2018-09-16 DIAGNOSIS — R531 Weakness: Secondary | ICD-10-CM | POA: Diagnosis not present

## 2018-09-16 DIAGNOSIS — G20 Parkinson's disease: Secondary | ICD-10-CM | POA: Diagnosis not present

## 2018-09-17 DIAGNOSIS — N400 Enlarged prostate without lower urinary tract symptoms: Secondary | ICD-10-CM | POA: Diagnosis not present

## 2018-09-17 DIAGNOSIS — G20 Parkinson's disease: Secondary | ICD-10-CM | POA: Diagnosis not present

## 2018-09-17 DIAGNOSIS — R531 Weakness: Secondary | ICD-10-CM | POA: Diagnosis not present

## 2018-09-17 DIAGNOSIS — R1310 Dysphagia, unspecified: Secondary | ICD-10-CM | POA: Diagnosis not present

## 2018-09-18 DIAGNOSIS — N400 Enlarged prostate without lower urinary tract symptoms: Secondary | ICD-10-CM | POA: Diagnosis not present

## 2018-09-18 DIAGNOSIS — R531 Weakness: Secondary | ICD-10-CM | POA: Diagnosis not present

## 2018-09-18 DIAGNOSIS — R1310 Dysphagia, unspecified: Secondary | ICD-10-CM | POA: Diagnosis not present

## 2018-09-18 DIAGNOSIS — G20 Parkinson's disease: Secondary | ICD-10-CM | POA: Diagnosis not present

## 2018-09-19 DIAGNOSIS — G20 Parkinson's disease: Secondary | ICD-10-CM | POA: Diagnosis not present

## 2018-09-19 DIAGNOSIS — N400 Enlarged prostate without lower urinary tract symptoms: Secondary | ICD-10-CM | POA: Diagnosis not present

## 2018-09-19 DIAGNOSIS — R531 Weakness: Secondary | ICD-10-CM | POA: Diagnosis not present

## 2018-09-19 DIAGNOSIS — R1310 Dysphagia, unspecified: Secondary | ICD-10-CM | POA: Diagnosis not present

## 2018-09-20 DIAGNOSIS — N400 Enlarged prostate without lower urinary tract symptoms: Secondary | ICD-10-CM | POA: Diagnosis not present

## 2018-09-20 DIAGNOSIS — R531 Weakness: Secondary | ICD-10-CM | POA: Diagnosis not present

## 2018-09-20 DIAGNOSIS — R1310 Dysphagia, unspecified: Secondary | ICD-10-CM | POA: Diagnosis not present

## 2018-09-20 DIAGNOSIS — G20 Parkinson's disease: Secondary | ICD-10-CM | POA: Diagnosis not present

## 2018-09-23 DIAGNOSIS — R1310 Dysphagia, unspecified: Secondary | ICD-10-CM | POA: Diagnosis not present

## 2018-09-23 DIAGNOSIS — R531 Weakness: Secondary | ICD-10-CM | POA: Diagnosis not present

## 2018-09-23 DIAGNOSIS — G20 Parkinson's disease: Secondary | ICD-10-CM | POA: Diagnosis not present

## 2018-09-23 DIAGNOSIS — N400 Enlarged prostate without lower urinary tract symptoms: Secondary | ICD-10-CM | POA: Diagnosis not present

## 2018-09-24 DIAGNOSIS — G20 Parkinson's disease: Secondary | ICD-10-CM | POA: Diagnosis not present

## 2018-09-24 DIAGNOSIS — R531 Weakness: Secondary | ICD-10-CM | POA: Diagnosis not present

## 2018-09-24 DIAGNOSIS — R1310 Dysphagia, unspecified: Secondary | ICD-10-CM | POA: Diagnosis not present

## 2018-09-24 DIAGNOSIS — N400 Enlarged prostate without lower urinary tract symptoms: Secondary | ICD-10-CM | POA: Diagnosis not present

## 2018-09-25 DIAGNOSIS — R531 Weakness: Secondary | ICD-10-CM | POA: Diagnosis not present

## 2018-09-25 DIAGNOSIS — R1310 Dysphagia, unspecified: Secondary | ICD-10-CM | POA: Diagnosis not present

## 2018-09-25 DIAGNOSIS — N400 Enlarged prostate without lower urinary tract symptoms: Secondary | ICD-10-CM | POA: Diagnosis not present

## 2018-09-25 DIAGNOSIS — G20 Parkinson's disease: Secondary | ICD-10-CM | POA: Diagnosis not present

## 2018-09-26 DIAGNOSIS — R1310 Dysphagia, unspecified: Secondary | ICD-10-CM | POA: Diagnosis not present

## 2018-09-26 DIAGNOSIS — G20 Parkinson's disease: Secondary | ICD-10-CM | POA: Diagnosis not present

## 2018-09-26 DIAGNOSIS — N400 Enlarged prostate without lower urinary tract symptoms: Secondary | ICD-10-CM | POA: Diagnosis not present

## 2018-09-26 DIAGNOSIS — R531 Weakness: Secondary | ICD-10-CM | POA: Diagnosis not present

## 2018-09-27 DIAGNOSIS — R531 Weakness: Secondary | ICD-10-CM | POA: Diagnosis not present

## 2018-09-27 DIAGNOSIS — N400 Enlarged prostate without lower urinary tract symptoms: Secondary | ICD-10-CM | POA: Diagnosis not present

## 2018-09-27 DIAGNOSIS — R1310 Dysphagia, unspecified: Secondary | ICD-10-CM | POA: Diagnosis not present

## 2018-09-27 DIAGNOSIS — G20 Parkinson's disease: Secondary | ICD-10-CM | POA: Diagnosis not present

## 2018-09-30 DIAGNOSIS — R1310 Dysphagia, unspecified: Secondary | ICD-10-CM | POA: Diagnosis not present

## 2018-09-30 DIAGNOSIS — N400 Enlarged prostate without lower urinary tract symptoms: Secondary | ICD-10-CM | POA: Diagnosis not present

## 2018-09-30 DIAGNOSIS — R531 Weakness: Secondary | ICD-10-CM | POA: Diagnosis not present

## 2018-09-30 DIAGNOSIS — G20 Parkinson's disease: Secondary | ICD-10-CM | POA: Diagnosis not present

## 2018-10-01 DIAGNOSIS — R1310 Dysphagia, unspecified: Secondary | ICD-10-CM | POA: Diagnosis not present

## 2018-10-01 DIAGNOSIS — N400 Enlarged prostate without lower urinary tract symptoms: Secondary | ICD-10-CM | POA: Diagnosis not present

## 2018-10-01 DIAGNOSIS — G20 Parkinson's disease: Secondary | ICD-10-CM | POA: Diagnosis not present

## 2018-10-01 DIAGNOSIS — R531 Weakness: Secondary | ICD-10-CM | POA: Diagnosis not present

## 2018-10-02 DIAGNOSIS — G20 Parkinson's disease: Secondary | ICD-10-CM | POA: Diagnosis not present

## 2018-10-02 DIAGNOSIS — N400 Enlarged prostate without lower urinary tract symptoms: Secondary | ICD-10-CM | POA: Diagnosis not present

## 2018-10-02 DIAGNOSIS — R1310 Dysphagia, unspecified: Secondary | ICD-10-CM | POA: Diagnosis not present

## 2018-10-02 DIAGNOSIS — R531 Weakness: Secondary | ICD-10-CM | POA: Diagnosis not present

## 2018-10-03 DIAGNOSIS — G20 Parkinson's disease: Secondary | ICD-10-CM | POA: Diagnosis not present

## 2018-10-03 DIAGNOSIS — N400 Enlarged prostate without lower urinary tract symptoms: Secondary | ICD-10-CM | POA: Diagnosis not present

## 2018-10-03 DIAGNOSIS — R531 Weakness: Secondary | ICD-10-CM | POA: Diagnosis not present

## 2018-10-03 DIAGNOSIS — R1310 Dysphagia, unspecified: Secondary | ICD-10-CM | POA: Diagnosis not present

## 2018-10-04 DIAGNOSIS — R1310 Dysphagia, unspecified: Secondary | ICD-10-CM | POA: Diagnosis not present

## 2018-10-04 DIAGNOSIS — G20 Parkinson's disease: Secondary | ICD-10-CM | POA: Diagnosis not present

## 2018-10-04 DIAGNOSIS — N400 Enlarged prostate without lower urinary tract symptoms: Secondary | ICD-10-CM | POA: Diagnosis not present

## 2018-10-04 DIAGNOSIS — R531 Weakness: Secondary | ICD-10-CM | POA: Diagnosis not present

## 2018-10-07 DIAGNOSIS — R531 Weakness: Secondary | ICD-10-CM | POA: Diagnosis not present

## 2018-10-07 DIAGNOSIS — G20 Parkinson's disease: Secondary | ICD-10-CM | POA: Diagnosis not present

## 2018-10-07 DIAGNOSIS — N400 Enlarged prostate without lower urinary tract symptoms: Secondary | ICD-10-CM | POA: Diagnosis not present

## 2018-10-07 DIAGNOSIS — R1310 Dysphagia, unspecified: Secondary | ICD-10-CM | POA: Diagnosis not present

## 2018-10-08 DIAGNOSIS — R531 Weakness: Secondary | ICD-10-CM | POA: Diagnosis not present

## 2018-10-08 DIAGNOSIS — N400 Enlarged prostate without lower urinary tract symptoms: Secondary | ICD-10-CM | POA: Diagnosis not present

## 2018-10-08 DIAGNOSIS — G20 Parkinson's disease: Secondary | ICD-10-CM | POA: Diagnosis not present

## 2018-10-08 DIAGNOSIS — R1310 Dysphagia, unspecified: Secondary | ICD-10-CM | POA: Diagnosis not present

## 2018-10-09 DIAGNOSIS — R1310 Dysphagia, unspecified: Secondary | ICD-10-CM | POA: Diagnosis not present

## 2018-10-09 DIAGNOSIS — G20 Parkinson's disease: Secondary | ICD-10-CM | POA: Diagnosis not present

## 2018-10-09 DIAGNOSIS — N400 Enlarged prostate without lower urinary tract symptoms: Secondary | ICD-10-CM | POA: Diagnosis not present

## 2018-10-09 DIAGNOSIS — R531 Weakness: Secondary | ICD-10-CM | POA: Diagnosis not present

## 2018-10-10 DIAGNOSIS — R531 Weakness: Secondary | ICD-10-CM | POA: Diagnosis not present

## 2018-10-10 DIAGNOSIS — R1310 Dysphagia, unspecified: Secondary | ICD-10-CM | POA: Diagnosis not present

## 2018-10-10 DIAGNOSIS — N400 Enlarged prostate without lower urinary tract symptoms: Secondary | ICD-10-CM | POA: Diagnosis not present

## 2018-10-10 DIAGNOSIS — G20 Parkinson's disease: Secondary | ICD-10-CM | POA: Diagnosis not present

## 2018-10-11 DIAGNOSIS — N400 Enlarged prostate without lower urinary tract symptoms: Secondary | ICD-10-CM | POA: Diagnosis not present

## 2018-10-11 DIAGNOSIS — R1310 Dysphagia, unspecified: Secondary | ICD-10-CM | POA: Diagnosis not present

## 2018-10-11 DIAGNOSIS — G20 Parkinson's disease: Secondary | ICD-10-CM | POA: Diagnosis not present

## 2018-10-11 DIAGNOSIS — R531 Weakness: Secondary | ICD-10-CM | POA: Diagnosis not present

## 2018-10-14 DIAGNOSIS — R1310 Dysphagia, unspecified: Secondary | ICD-10-CM | POA: Diagnosis not present

## 2018-10-14 DIAGNOSIS — N400 Enlarged prostate without lower urinary tract symptoms: Secondary | ICD-10-CM | POA: Diagnosis not present

## 2018-10-14 DIAGNOSIS — G20 Parkinson's disease: Secondary | ICD-10-CM | POA: Diagnosis not present

## 2018-10-14 DIAGNOSIS — R531 Weakness: Secondary | ICD-10-CM | POA: Diagnosis not present

## 2018-10-15 DIAGNOSIS — R531 Weakness: Secondary | ICD-10-CM | POA: Diagnosis not present

## 2018-10-15 DIAGNOSIS — N400 Enlarged prostate without lower urinary tract symptoms: Secondary | ICD-10-CM | POA: Diagnosis not present

## 2018-10-15 DIAGNOSIS — R1310 Dysphagia, unspecified: Secondary | ICD-10-CM | POA: Diagnosis not present

## 2018-10-15 DIAGNOSIS — G20 Parkinson's disease: Secondary | ICD-10-CM | POA: Diagnosis not present

## 2018-10-16 DIAGNOSIS — R531 Weakness: Secondary | ICD-10-CM | POA: Diagnosis not present

## 2018-10-16 DIAGNOSIS — N400 Enlarged prostate without lower urinary tract symptoms: Secondary | ICD-10-CM | POA: Diagnosis not present

## 2018-10-16 DIAGNOSIS — G20 Parkinson's disease: Secondary | ICD-10-CM | POA: Diagnosis not present

## 2018-10-16 DIAGNOSIS — R1310 Dysphagia, unspecified: Secondary | ICD-10-CM | POA: Diagnosis not present

## 2018-10-17 DIAGNOSIS — G20 Parkinson's disease: Secondary | ICD-10-CM | POA: Diagnosis not present

## 2018-10-17 DIAGNOSIS — R531 Weakness: Secondary | ICD-10-CM | POA: Diagnosis not present

## 2018-10-17 DIAGNOSIS — R1310 Dysphagia, unspecified: Secondary | ICD-10-CM | POA: Diagnosis not present

## 2018-10-17 DIAGNOSIS — N400 Enlarged prostate without lower urinary tract symptoms: Secondary | ICD-10-CM | POA: Diagnosis not present

## 2018-10-18 DIAGNOSIS — R531 Weakness: Secondary | ICD-10-CM | POA: Diagnosis not present

## 2018-10-18 DIAGNOSIS — G20 Parkinson's disease: Secondary | ICD-10-CM | POA: Diagnosis not present

## 2018-10-18 DIAGNOSIS — R1310 Dysphagia, unspecified: Secondary | ICD-10-CM | POA: Diagnosis not present

## 2018-10-18 DIAGNOSIS — N400 Enlarged prostate without lower urinary tract symptoms: Secondary | ICD-10-CM | POA: Diagnosis not present

## 2018-10-21 DIAGNOSIS — R1310 Dysphagia, unspecified: Secondary | ICD-10-CM | POA: Diagnosis not present

## 2018-10-21 DIAGNOSIS — N400 Enlarged prostate without lower urinary tract symptoms: Secondary | ICD-10-CM | POA: Diagnosis not present

## 2018-10-21 DIAGNOSIS — G20 Parkinson's disease: Secondary | ICD-10-CM | POA: Diagnosis not present

## 2018-10-21 DIAGNOSIS — R531 Weakness: Secondary | ICD-10-CM | POA: Diagnosis not present

## 2018-10-22 DIAGNOSIS — G20 Parkinson's disease: Secondary | ICD-10-CM | POA: Diagnosis not present

## 2018-10-22 DIAGNOSIS — R531 Weakness: Secondary | ICD-10-CM | POA: Diagnosis not present

## 2018-10-22 DIAGNOSIS — N400 Enlarged prostate without lower urinary tract symptoms: Secondary | ICD-10-CM | POA: Diagnosis not present

## 2018-10-22 DIAGNOSIS — R1310 Dysphagia, unspecified: Secondary | ICD-10-CM | POA: Diagnosis not present

## 2018-10-23 DIAGNOSIS — R1310 Dysphagia, unspecified: Secondary | ICD-10-CM | POA: Diagnosis not present

## 2018-10-23 DIAGNOSIS — R531 Weakness: Secondary | ICD-10-CM | POA: Diagnosis not present

## 2018-10-23 DIAGNOSIS — N400 Enlarged prostate without lower urinary tract symptoms: Secondary | ICD-10-CM | POA: Diagnosis not present

## 2018-10-23 DIAGNOSIS — G20 Parkinson's disease: Secondary | ICD-10-CM | POA: Diagnosis not present

## 2018-10-24 DIAGNOSIS — R1310 Dysphagia, unspecified: Secondary | ICD-10-CM | POA: Diagnosis not present

## 2018-10-24 DIAGNOSIS — G20 Parkinson's disease: Secondary | ICD-10-CM | POA: Diagnosis not present

## 2018-10-24 DIAGNOSIS — R531 Weakness: Secondary | ICD-10-CM | POA: Diagnosis not present

## 2018-10-24 DIAGNOSIS — N400 Enlarged prostate without lower urinary tract symptoms: Secondary | ICD-10-CM | POA: Diagnosis not present

## 2018-10-25 DIAGNOSIS — G20 Parkinson's disease: Secondary | ICD-10-CM | POA: Diagnosis not present

## 2018-10-25 DIAGNOSIS — R1310 Dysphagia, unspecified: Secondary | ICD-10-CM | POA: Diagnosis not present

## 2018-10-25 DIAGNOSIS — N400 Enlarged prostate without lower urinary tract symptoms: Secondary | ICD-10-CM | POA: Diagnosis not present

## 2018-10-25 DIAGNOSIS — R531 Weakness: Secondary | ICD-10-CM | POA: Diagnosis not present

## 2018-10-28 DIAGNOSIS — R531 Weakness: Secondary | ICD-10-CM | POA: Diagnosis not present

## 2018-10-28 DIAGNOSIS — N400 Enlarged prostate without lower urinary tract symptoms: Secondary | ICD-10-CM | POA: Diagnosis not present

## 2018-10-28 DIAGNOSIS — G20 Parkinson's disease: Secondary | ICD-10-CM | POA: Diagnosis not present

## 2018-10-28 DIAGNOSIS — R1310 Dysphagia, unspecified: Secondary | ICD-10-CM | POA: Diagnosis not present

## 2018-10-29 DIAGNOSIS — R1310 Dysphagia, unspecified: Secondary | ICD-10-CM | POA: Diagnosis not present

## 2018-10-29 DIAGNOSIS — N400 Enlarged prostate without lower urinary tract symptoms: Secondary | ICD-10-CM | POA: Diagnosis not present

## 2018-10-29 DIAGNOSIS — G20 Parkinson's disease: Secondary | ICD-10-CM | POA: Diagnosis not present

## 2018-10-29 DIAGNOSIS — R531 Weakness: Secondary | ICD-10-CM | POA: Diagnosis not present

## 2018-10-30 DIAGNOSIS — N400 Enlarged prostate without lower urinary tract symptoms: Secondary | ICD-10-CM | POA: Diagnosis not present

## 2018-10-30 DIAGNOSIS — R531 Weakness: Secondary | ICD-10-CM | POA: Diagnosis not present

## 2018-10-30 DIAGNOSIS — R1310 Dysphagia, unspecified: Secondary | ICD-10-CM | POA: Diagnosis not present

## 2018-10-30 DIAGNOSIS — G20 Parkinson's disease: Secondary | ICD-10-CM | POA: Diagnosis not present

## 2018-11-01 DIAGNOSIS — R531 Weakness: Secondary | ICD-10-CM | POA: Diagnosis not present

## 2018-11-01 DIAGNOSIS — R1310 Dysphagia, unspecified: Secondary | ICD-10-CM | POA: Diagnosis not present

## 2018-11-01 DIAGNOSIS — G20 Parkinson's disease: Secondary | ICD-10-CM | POA: Diagnosis not present

## 2018-11-01 DIAGNOSIS — N400 Enlarged prostate without lower urinary tract symptoms: Secondary | ICD-10-CM | POA: Diagnosis not present

## 2018-11-03 DIAGNOSIS — R1310 Dysphagia, unspecified: Secondary | ICD-10-CM | POA: Diagnosis not present

## 2018-11-03 DIAGNOSIS — R531 Weakness: Secondary | ICD-10-CM | POA: Diagnosis not present

## 2018-11-03 DIAGNOSIS — N400 Enlarged prostate without lower urinary tract symptoms: Secondary | ICD-10-CM | POA: Diagnosis not present

## 2018-11-03 DIAGNOSIS — G20 Parkinson's disease: Secondary | ICD-10-CM | POA: Diagnosis not present

## 2018-11-04 DIAGNOSIS — R531 Weakness: Secondary | ICD-10-CM | POA: Diagnosis not present

## 2018-11-04 DIAGNOSIS — G20 Parkinson's disease: Secondary | ICD-10-CM | POA: Diagnosis not present

## 2018-11-04 DIAGNOSIS — R1310 Dysphagia, unspecified: Secondary | ICD-10-CM | POA: Diagnosis not present

## 2018-11-04 DIAGNOSIS — N400 Enlarged prostate without lower urinary tract symptoms: Secondary | ICD-10-CM | POA: Diagnosis not present

## 2018-11-05 DIAGNOSIS — G20 Parkinson's disease: Secondary | ICD-10-CM | POA: Diagnosis not present

## 2018-11-05 DIAGNOSIS — R1310 Dysphagia, unspecified: Secondary | ICD-10-CM | POA: Diagnosis not present

## 2018-11-05 DIAGNOSIS — R531 Weakness: Secondary | ICD-10-CM | POA: Diagnosis not present

## 2018-11-05 DIAGNOSIS — N400 Enlarged prostate without lower urinary tract symptoms: Secondary | ICD-10-CM | POA: Diagnosis not present

## 2018-11-06 DIAGNOSIS — R1310 Dysphagia, unspecified: Secondary | ICD-10-CM | POA: Diagnosis not present

## 2018-11-06 DIAGNOSIS — G20 Parkinson's disease: Secondary | ICD-10-CM | POA: Diagnosis not present

## 2018-11-06 DIAGNOSIS — N400 Enlarged prostate without lower urinary tract symptoms: Secondary | ICD-10-CM | POA: Diagnosis not present

## 2018-11-06 DIAGNOSIS — R531 Weakness: Secondary | ICD-10-CM | POA: Diagnosis not present

## 2018-11-07 DIAGNOSIS — G20 Parkinson's disease: Secondary | ICD-10-CM | POA: Diagnosis not present

## 2018-11-07 DIAGNOSIS — R531 Weakness: Secondary | ICD-10-CM | POA: Diagnosis not present

## 2018-11-07 DIAGNOSIS — R1310 Dysphagia, unspecified: Secondary | ICD-10-CM | POA: Diagnosis not present

## 2018-11-07 DIAGNOSIS — N400 Enlarged prostate without lower urinary tract symptoms: Secondary | ICD-10-CM | POA: Diagnosis not present

## 2018-11-08 DIAGNOSIS — R1310 Dysphagia, unspecified: Secondary | ICD-10-CM | POA: Diagnosis not present

## 2018-11-08 DIAGNOSIS — G20 Parkinson's disease: Secondary | ICD-10-CM | POA: Diagnosis not present

## 2018-11-08 DIAGNOSIS — R531 Weakness: Secondary | ICD-10-CM | POA: Diagnosis not present

## 2018-11-08 DIAGNOSIS — N400 Enlarged prostate without lower urinary tract symptoms: Secondary | ICD-10-CM | POA: Diagnosis not present

## 2018-11-12 DIAGNOSIS — R1310 Dysphagia, unspecified: Secondary | ICD-10-CM | POA: Diagnosis not present

## 2018-11-12 DIAGNOSIS — G20 Parkinson's disease: Secondary | ICD-10-CM | POA: Diagnosis not present

## 2018-11-12 DIAGNOSIS — R531 Weakness: Secondary | ICD-10-CM | POA: Diagnosis not present

## 2018-11-12 DIAGNOSIS — N400 Enlarged prostate without lower urinary tract symptoms: Secondary | ICD-10-CM | POA: Diagnosis not present

## 2018-11-13 DIAGNOSIS — R1310 Dysphagia, unspecified: Secondary | ICD-10-CM | POA: Diagnosis not present

## 2018-11-13 DIAGNOSIS — G20 Parkinson's disease: Secondary | ICD-10-CM | POA: Diagnosis not present

## 2018-11-13 DIAGNOSIS — R531 Weakness: Secondary | ICD-10-CM | POA: Diagnosis not present

## 2018-11-13 DIAGNOSIS — N400 Enlarged prostate without lower urinary tract symptoms: Secondary | ICD-10-CM | POA: Diagnosis not present

## 2018-11-14 DIAGNOSIS — G20 Parkinson's disease: Secondary | ICD-10-CM | POA: Diagnosis not present

## 2018-11-14 DIAGNOSIS — R531 Weakness: Secondary | ICD-10-CM | POA: Diagnosis not present

## 2018-11-14 DIAGNOSIS — N400 Enlarged prostate without lower urinary tract symptoms: Secondary | ICD-10-CM | POA: Diagnosis not present

## 2018-11-14 DIAGNOSIS — R1310 Dysphagia, unspecified: Secondary | ICD-10-CM | POA: Diagnosis not present

## 2018-11-15 DIAGNOSIS — R1310 Dysphagia, unspecified: Secondary | ICD-10-CM | POA: Diagnosis not present

## 2018-11-15 DIAGNOSIS — N400 Enlarged prostate without lower urinary tract symptoms: Secondary | ICD-10-CM | POA: Diagnosis not present

## 2018-11-15 DIAGNOSIS — R531 Weakness: Secondary | ICD-10-CM | POA: Diagnosis not present

## 2018-11-15 DIAGNOSIS — G20 Parkinson's disease: Secondary | ICD-10-CM | POA: Diagnosis not present

## 2018-11-18 DIAGNOSIS — R531 Weakness: Secondary | ICD-10-CM | POA: Diagnosis not present

## 2018-11-18 DIAGNOSIS — R1310 Dysphagia, unspecified: Secondary | ICD-10-CM | POA: Diagnosis not present

## 2018-11-18 DIAGNOSIS — N400 Enlarged prostate without lower urinary tract symptoms: Secondary | ICD-10-CM | POA: Diagnosis not present

## 2018-11-18 DIAGNOSIS — G20 Parkinson's disease: Secondary | ICD-10-CM | POA: Diagnosis not present

## 2018-11-19 DIAGNOSIS — R1310 Dysphagia, unspecified: Secondary | ICD-10-CM | POA: Diagnosis not present

## 2018-11-19 DIAGNOSIS — N400 Enlarged prostate without lower urinary tract symptoms: Secondary | ICD-10-CM | POA: Diagnosis not present

## 2018-11-19 DIAGNOSIS — G20 Parkinson's disease: Secondary | ICD-10-CM | POA: Diagnosis not present

## 2018-11-19 DIAGNOSIS — R531 Weakness: Secondary | ICD-10-CM | POA: Diagnosis not present

## 2018-11-20 DIAGNOSIS — G20 Parkinson's disease: Secondary | ICD-10-CM | POA: Diagnosis not present

## 2018-11-20 DIAGNOSIS — R1310 Dysphagia, unspecified: Secondary | ICD-10-CM | POA: Diagnosis not present

## 2018-11-20 DIAGNOSIS — N400 Enlarged prostate without lower urinary tract symptoms: Secondary | ICD-10-CM | POA: Diagnosis not present

## 2018-11-20 DIAGNOSIS — R531 Weakness: Secondary | ICD-10-CM | POA: Diagnosis not present

## 2018-11-21 DIAGNOSIS — G20 Parkinson's disease: Secondary | ICD-10-CM | POA: Diagnosis not present

## 2018-11-21 DIAGNOSIS — R1310 Dysphagia, unspecified: Secondary | ICD-10-CM | POA: Diagnosis not present

## 2018-11-21 DIAGNOSIS — N400 Enlarged prostate without lower urinary tract symptoms: Secondary | ICD-10-CM | POA: Diagnosis not present

## 2018-11-21 DIAGNOSIS — R531 Weakness: Secondary | ICD-10-CM | POA: Diagnosis not present

## 2018-11-22 DIAGNOSIS — N400 Enlarged prostate without lower urinary tract symptoms: Secondary | ICD-10-CM | POA: Diagnosis not present

## 2018-11-22 DIAGNOSIS — G20 Parkinson's disease: Secondary | ICD-10-CM | POA: Diagnosis not present

## 2018-11-22 DIAGNOSIS — R531 Weakness: Secondary | ICD-10-CM | POA: Diagnosis not present

## 2018-11-22 DIAGNOSIS — R1310 Dysphagia, unspecified: Secondary | ICD-10-CM | POA: Diagnosis not present

## 2018-11-25 DIAGNOSIS — R531 Weakness: Secondary | ICD-10-CM | POA: Diagnosis not present

## 2018-11-25 DIAGNOSIS — G20 Parkinson's disease: Secondary | ICD-10-CM | POA: Diagnosis not present

## 2018-11-25 DIAGNOSIS — N400 Enlarged prostate without lower urinary tract symptoms: Secondary | ICD-10-CM | POA: Diagnosis not present

## 2018-11-25 DIAGNOSIS — R1310 Dysphagia, unspecified: Secondary | ICD-10-CM | POA: Diagnosis not present

## 2018-11-26 DIAGNOSIS — R531 Weakness: Secondary | ICD-10-CM | POA: Diagnosis not present

## 2018-11-26 DIAGNOSIS — G20 Parkinson's disease: Secondary | ICD-10-CM | POA: Diagnosis not present

## 2018-11-26 DIAGNOSIS — R1310 Dysphagia, unspecified: Secondary | ICD-10-CM | POA: Diagnosis not present

## 2018-11-26 DIAGNOSIS — N400 Enlarged prostate without lower urinary tract symptoms: Secondary | ICD-10-CM | POA: Diagnosis not present

## 2018-11-27 DIAGNOSIS — R1310 Dysphagia, unspecified: Secondary | ICD-10-CM | POA: Diagnosis not present

## 2018-11-27 DIAGNOSIS — H903 Sensorineural hearing loss, bilateral: Secondary | ICD-10-CM | POA: Diagnosis not present

## 2018-11-27 DIAGNOSIS — G20 Parkinson's disease: Secondary | ICD-10-CM | POA: Diagnosis not present

## 2018-11-27 DIAGNOSIS — N400 Enlarged prostate without lower urinary tract symptoms: Secondary | ICD-10-CM | POA: Diagnosis not present

## 2018-11-27 DIAGNOSIS — R531 Weakness: Secondary | ICD-10-CM | POA: Diagnosis not present

## 2018-11-29 DIAGNOSIS — R531 Weakness: Secondary | ICD-10-CM | POA: Diagnosis not present

## 2018-11-29 DIAGNOSIS — N400 Enlarged prostate without lower urinary tract symptoms: Secondary | ICD-10-CM | POA: Diagnosis not present

## 2018-11-29 DIAGNOSIS — R1310 Dysphagia, unspecified: Secondary | ICD-10-CM | POA: Diagnosis not present

## 2018-11-29 DIAGNOSIS — G20 Parkinson's disease: Secondary | ICD-10-CM | POA: Diagnosis not present

## 2019-01-03 DIAGNOSIS — R531 Weakness: Secondary | ICD-10-CM | POA: Diagnosis not present

## 2019-01-03 DIAGNOSIS — G20 Parkinson's disease: Secondary | ICD-10-CM | POA: Diagnosis not present

## 2019-01-03 DIAGNOSIS — R1310 Dysphagia, unspecified: Secondary | ICD-10-CM | POA: Diagnosis not present

## 2019-01-03 DIAGNOSIS — N400 Enlarged prostate without lower urinary tract symptoms: Secondary | ICD-10-CM | POA: Diagnosis not present

## 2019-01-20 DIAGNOSIS — R531 Weakness: Secondary | ICD-10-CM | POA: Diagnosis not present

## 2019-01-20 DIAGNOSIS — R1310 Dysphagia, unspecified: Secondary | ICD-10-CM | POA: Diagnosis not present

## 2019-01-20 DIAGNOSIS — N400 Enlarged prostate without lower urinary tract symptoms: Secondary | ICD-10-CM | POA: Diagnosis not present

## 2019-01-20 DIAGNOSIS — G20 Parkinson's disease: Secondary | ICD-10-CM | POA: Diagnosis not present

## 2019-01-21 DIAGNOSIS — R531 Weakness: Secondary | ICD-10-CM | POA: Diagnosis not present

## 2019-01-21 DIAGNOSIS — N400 Enlarged prostate without lower urinary tract symptoms: Secondary | ICD-10-CM | POA: Diagnosis not present

## 2019-01-21 DIAGNOSIS — G20 Parkinson's disease: Secondary | ICD-10-CM | POA: Diagnosis not present

## 2019-01-21 DIAGNOSIS — R1310 Dysphagia, unspecified: Secondary | ICD-10-CM | POA: Diagnosis not present

## 2019-01-22 DIAGNOSIS — R1310 Dysphagia, unspecified: Secondary | ICD-10-CM | POA: Diagnosis not present

## 2019-01-22 DIAGNOSIS — G20 Parkinson's disease: Secondary | ICD-10-CM | POA: Diagnosis not present

## 2019-01-22 DIAGNOSIS — R531 Weakness: Secondary | ICD-10-CM | POA: Diagnosis not present

## 2019-01-22 DIAGNOSIS — N400 Enlarged prostate without lower urinary tract symptoms: Secondary | ICD-10-CM | POA: Diagnosis not present

## 2019-01-23 DIAGNOSIS — R1310 Dysphagia, unspecified: Secondary | ICD-10-CM | POA: Diagnosis not present

## 2019-01-23 DIAGNOSIS — G20 Parkinson's disease: Secondary | ICD-10-CM | POA: Diagnosis not present

## 2019-01-23 DIAGNOSIS — R531 Weakness: Secondary | ICD-10-CM | POA: Diagnosis not present

## 2019-01-23 DIAGNOSIS — N400 Enlarged prostate without lower urinary tract symptoms: Secondary | ICD-10-CM | POA: Diagnosis not present

## 2019-01-24 DIAGNOSIS — G20 Parkinson's disease: Secondary | ICD-10-CM | POA: Diagnosis not present

## 2019-01-24 DIAGNOSIS — N400 Enlarged prostate without lower urinary tract symptoms: Secondary | ICD-10-CM | POA: Diagnosis not present

## 2019-01-24 DIAGNOSIS — R1310 Dysphagia, unspecified: Secondary | ICD-10-CM | POA: Diagnosis not present

## 2019-01-24 DIAGNOSIS — R531 Weakness: Secondary | ICD-10-CM | POA: Diagnosis not present

## 2019-01-27 DIAGNOSIS — R531 Weakness: Secondary | ICD-10-CM | POA: Diagnosis not present

## 2019-01-27 DIAGNOSIS — R1310 Dysphagia, unspecified: Secondary | ICD-10-CM | POA: Diagnosis not present

## 2019-01-27 DIAGNOSIS — G20 Parkinson's disease: Secondary | ICD-10-CM | POA: Diagnosis not present

## 2019-01-27 DIAGNOSIS — N400 Enlarged prostate without lower urinary tract symptoms: Secondary | ICD-10-CM | POA: Diagnosis not present

## 2019-01-28 DIAGNOSIS — R531 Weakness: Secondary | ICD-10-CM | POA: Diagnosis not present

## 2019-01-28 DIAGNOSIS — R1310 Dysphagia, unspecified: Secondary | ICD-10-CM | POA: Diagnosis not present

## 2019-01-28 DIAGNOSIS — N400 Enlarged prostate without lower urinary tract symptoms: Secondary | ICD-10-CM | POA: Diagnosis not present

## 2019-01-28 DIAGNOSIS — G20 Parkinson's disease: Secondary | ICD-10-CM | POA: Diagnosis not present

## 2019-01-29 DIAGNOSIS — N400 Enlarged prostate without lower urinary tract symptoms: Secondary | ICD-10-CM | POA: Diagnosis not present

## 2019-01-29 DIAGNOSIS — R1310 Dysphagia, unspecified: Secondary | ICD-10-CM | POA: Diagnosis not present

## 2019-01-29 DIAGNOSIS — R531 Weakness: Secondary | ICD-10-CM | POA: Diagnosis not present

## 2019-01-29 DIAGNOSIS — G20 Parkinson's disease: Secondary | ICD-10-CM | POA: Diagnosis not present

## 2019-01-30 DIAGNOSIS — N400 Enlarged prostate without lower urinary tract symptoms: Secondary | ICD-10-CM | POA: Diagnosis not present

## 2019-01-30 DIAGNOSIS — R531 Weakness: Secondary | ICD-10-CM | POA: Diagnosis not present

## 2019-01-30 DIAGNOSIS — G20 Parkinson's disease: Secondary | ICD-10-CM | POA: Diagnosis not present

## 2019-01-30 DIAGNOSIS — R1310 Dysphagia, unspecified: Secondary | ICD-10-CM | POA: Diagnosis not present

## 2019-01-31 DIAGNOSIS — G20 Parkinson's disease: Secondary | ICD-10-CM | POA: Diagnosis not present

## 2019-01-31 DIAGNOSIS — N400 Enlarged prostate without lower urinary tract symptoms: Secondary | ICD-10-CM | POA: Diagnosis not present

## 2019-01-31 DIAGNOSIS — R1310 Dysphagia, unspecified: Secondary | ICD-10-CM | POA: Diagnosis not present

## 2019-01-31 DIAGNOSIS — R531 Weakness: Secondary | ICD-10-CM | POA: Diagnosis not present

## 2019-02-03 DIAGNOSIS — R1310 Dysphagia, unspecified: Secondary | ICD-10-CM | POA: Diagnosis not present

## 2019-02-03 DIAGNOSIS — N400 Enlarged prostate without lower urinary tract symptoms: Secondary | ICD-10-CM | POA: Diagnosis not present

## 2019-02-03 DIAGNOSIS — G20 Parkinson's disease: Secondary | ICD-10-CM | POA: Diagnosis not present

## 2019-02-03 DIAGNOSIS — R531 Weakness: Secondary | ICD-10-CM | POA: Diagnosis not present

## 2019-02-04 DIAGNOSIS — G20 Parkinson's disease: Secondary | ICD-10-CM | POA: Diagnosis not present

## 2019-02-04 DIAGNOSIS — N400 Enlarged prostate without lower urinary tract symptoms: Secondary | ICD-10-CM | POA: Diagnosis not present

## 2019-02-04 DIAGNOSIS — R531 Weakness: Secondary | ICD-10-CM | POA: Diagnosis not present

## 2019-02-04 DIAGNOSIS — R1310 Dysphagia, unspecified: Secondary | ICD-10-CM | POA: Diagnosis not present

## 2019-02-05 DIAGNOSIS — R531 Weakness: Secondary | ICD-10-CM | POA: Diagnosis not present

## 2019-02-05 DIAGNOSIS — N400 Enlarged prostate without lower urinary tract symptoms: Secondary | ICD-10-CM | POA: Diagnosis not present

## 2019-02-05 DIAGNOSIS — G20 Parkinson's disease: Secondary | ICD-10-CM | POA: Diagnosis not present

## 2019-02-05 DIAGNOSIS — R1310 Dysphagia, unspecified: Secondary | ICD-10-CM | POA: Diagnosis not present

## 2019-02-06 DIAGNOSIS — N400 Enlarged prostate without lower urinary tract symptoms: Secondary | ICD-10-CM | POA: Diagnosis not present

## 2019-02-06 DIAGNOSIS — R531 Weakness: Secondary | ICD-10-CM | POA: Diagnosis not present

## 2019-02-06 DIAGNOSIS — R1310 Dysphagia, unspecified: Secondary | ICD-10-CM | POA: Diagnosis not present

## 2019-02-06 DIAGNOSIS — G20 Parkinson's disease: Secondary | ICD-10-CM | POA: Diagnosis not present

## 2019-02-07 DIAGNOSIS — R531 Weakness: Secondary | ICD-10-CM | POA: Diagnosis not present

## 2019-02-07 DIAGNOSIS — R1310 Dysphagia, unspecified: Secondary | ICD-10-CM | POA: Diagnosis not present

## 2019-02-07 DIAGNOSIS — N400 Enlarged prostate without lower urinary tract symptoms: Secondary | ICD-10-CM | POA: Diagnosis not present

## 2019-02-07 DIAGNOSIS — G20 Parkinson's disease: Secondary | ICD-10-CM | POA: Diagnosis not present

## 2019-02-10 DIAGNOSIS — G20 Parkinson's disease: Secondary | ICD-10-CM | POA: Diagnosis not present

## 2019-02-10 DIAGNOSIS — R1310 Dysphagia, unspecified: Secondary | ICD-10-CM | POA: Diagnosis not present

## 2019-02-10 DIAGNOSIS — R531 Weakness: Secondary | ICD-10-CM | POA: Diagnosis not present

## 2019-02-10 DIAGNOSIS — N400 Enlarged prostate without lower urinary tract symptoms: Secondary | ICD-10-CM | POA: Diagnosis not present

## 2019-02-12 DIAGNOSIS — R1310 Dysphagia, unspecified: Secondary | ICD-10-CM | POA: Diagnosis not present

## 2019-02-12 DIAGNOSIS — R531 Weakness: Secondary | ICD-10-CM | POA: Diagnosis not present

## 2019-02-12 DIAGNOSIS — G20 Parkinson's disease: Secondary | ICD-10-CM | POA: Diagnosis not present

## 2019-02-12 DIAGNOSIS — N400 Enlarged prostate without lower urinary tract symptoms: Secondary | ICD-10-CM | POA: Diagnosis not present

## 2019-02-13 DIAGNOSIS — H903 Sensorineural hearing loss, bilateral: Secondary | ICD-10-CM | POA: Diagnosis not present

## 2019-02-13 DIAGNOSIS — R531 Weakness: Secondary | ICD-10-CM | POA: Diagnosis not present

## 2019-02-13 DIAGNOSIS — N400 Enlarged prostate without lower urinary tract symptoms: Secondary | ICD-10-CM | POA: Diagnosis not present

## 2019-02-13 DIAGNOSIS — R1310 Dysphagia, unspecified: Secondary | ICD-10-CM | POA: Diagnosis not present

## 2019-02-13 DIAGNOSIS — G20 Parkinson's disease: Secondary | ICD-10-CM | POA: Diagnosis not present

## 2019-02-14 DIAGNOSIS — N400 Enlarged prostate without lower urinary tract symptoms: Secondary | ICD-10-CM | POA: Diagnosis not present

## 2019-02-14 DIAGNOSIS — R531 Weakness: Secondary | ICD-10-CM | POA: Diagnosis not present

## 2019-02-14 DIAGNOSIS — R1310 Dysphagia, unspecified: Secondary | ICD-10-CM | POA: Diagnosis not present

## 2019-02-14 DIAGNOSIS — G20 Parkinson's disease: Secondary | ICD-10-CM | POA: Diagnosis not present

## 2019-02-17 DIAGNOSIS — G20 Parkinson's disease: Secondary | ICD-10-CM | POA: Diagnosis not present

## 2019-02-17 DIAGNOSIS — N400 Enlarged prostate without lower urinary tract symptoms: Secondary | ICD-10-CM | POA: Diagnosis not present

## 2019-02-17 DIAGNOSIS — R1310 Dysphagia, unspecified: Secondary | ICD-10-CM | POA: Diagnosis not present

## 2019-02-17 DIAGNOSIS — R531 Weakness: Secondary | ICD-10-CM | POA: Diagnosis not present

## 2019-02-18 DIAGNOSIS — R1310 Dysphagia, unspecified: Secondary | ICD-10-CM | POA: Diagnosis not present

## 2019-02-18 DIAGNOSIS — R531 Weakness: Secondary | ICD-10-CM | POA: Diagnosis not present

## 2019-02-18 DIAGNOSIS — N400 Enlarged prostate without lower urinary tract symptoms: Secondary | ICD-10-CM | POA: Diagnosis not present

## 2019-02-18 DIAGNOSIS — G20 Parkinson's disease: Secondary | ICD-10-CM | POA: Diagnosis not present

## 2019-02-19 DIAGNOSIS — R1310 Dysphagia, unspecified: Secondary | ICD-10-CM | POA: Diagnosis not present

## 2019-02-19 DIAGNOSIS — G20 Parkinson's disease: Secondary | ICD-10-CM | POA: Diagnosis not present

## 2019-02-19 DIAGNOSIS — N400 Enlarged prostate without lower urinary tract symptoms: Secondary | ICD-10-CM | POA: Diagnosis not present

## 2019-02-19 DIAGNOSIS — R531 Weakness: Secondary | ICD-10-CM | POA: Diagnosis not present

## 2019-02-21 DIAGNOSIS — R531 Weakness: Secondary | ICD-10-CM | POA: Diagnosis not present

## 2019-02-21 DIAGNOSIS — R1310 Dysphagia, unspecified: Secondary | ICD-10-CM | POA: Diagnosis not present

## 2019-02-21 DIAGNOSIS — N400 Enlarged prostate without lower urinary tract symptoms: Secondary | ICD-10-CM | POA: Diagnosis not present

## 2019-02-21 DIAGNOSIS — G20 Parkinson's disease: Secondary | ICD-10-CM | POA: Diagnosis not present

## 2019-02-24 DIAGNOSIS — G20 Parkinson's disease: Secondary | ICD-10-CM | POA: Diagnosis not present

## 2019-02-24 DIAGNOSIS — R1310 Dysphagia, unspecified: Secondary | ICD-10-CM | POA: Diagnosis not present

## 2019-02-24 DIAGNOSIS — R531 Weakness: Secondary | ICD-10-CM | POA: Diagnosis not present

## 2019-02-24 DIAGNOSIS — N400 Enlarged prostate without lower urinary tract symptoms: Secondary | ICD-10-CM | POA: Diagnosis not present

## 2019-02-25 DIAGNOSIS — G20 Parkinson's disease: Secondary | ICD-10-CM | POA: Diagnosis not present

## 2019-02-25 DIAGNOSIS — R531 Weakness: Secondary | ICD-10-CM | POA: Diagnosis not present

## 2019-02-25 DIAGNOSIS — N400 Enlarged prostate without lower urinary tract symptoms: Secondary | ICD-10-CM | POA: Diagnosis not present

## 2019-02-25 DIAGNOSIS — R1310 Dysphagia, unspecified: Secondary | ICD-10-CM | POA: Diagnosis not present

## 2019-02-26 DIAGNOSIS — G20 Parkinson's disease: Secondary | ICD-10-CM | POA: Diagnosis not present

## 2019-02-26 DIAGNOSIS — R531 Weakness: Secondary | ICD-10-CM | POA: Diagnosis not present

## 2019-02-26 DIAGNOSIS — N400 Enlarged prostate without lower urinary tract symptoms: Secondary | ICD-10-CM | POA: Diagnosis not present

## 2019-02-26 DIAGNOSIS — R1310 Dysphagia, unspecified: Secondary | ICD-10-CM | POA: Diagnosis not present

## 2019-02-27 DIAGNOSIS — R531 Weakness: Secondary | ICD-10-CM | POA: Diagnosis not present

## 2019-02-27 DIAGNOSIS — R1310 Dysphagia, unspecified: Secondary | ICD-10-CM | POA: Diagnosis not present

## 2019-02-27 DIAGNOSIS — N400 Enlarged prostate without lower urinary tract symptoms: Secondary | ICD-10-CM | POA: Diagnosis not present

## 2019-02-27 DIAGNOSIS — G20 Parkinson's disease: Secondary | ICD-10-CM | POA: Diagnosis not present

## 2019-02-28 DIAGNOSIS — R1310 Dysphagia, unspecified: Secondary | ICD-10-CM | POA: Diagnosis not present

## 2019-02-28 DIAGNOSIS — G20 Parkinson's disease: Secondary | ICD-10-CM | POA: Diagnosis not present

## 2019-02-28 DIAGNOSIS — R531 Weakness: Secondary | ICD-10-CM | POA: Diagnosis not present

## 2019-02-28 DIAGNOSIS — N400 Enlarged prostate without lower urinary tract symptoms: Secondary | ICD-10-CM | POA: Diagnosis not present

## 2019-03-03 DIAGNOSIS — R1310 Dysphagia, unspecified: Secondary | ICD-10-CM | POA: Diagnosis not present

## 2019-03-03 DIAGNOSIS — R531 Weakness: Secondary | ICD-10-CM | POA: Diagnosis not present

## 2019-03-03 DIAGNOSIS — N400 Enlarged prostate without lower urinary tract symptoms: Secondary | ICD-10-CM | POA: Diagnosis not present

## 2019-03-03 DIAGNOSIS — G20 Parkinson's disease: Secondary | ICD-10-CM | POA: Diagnosis not present

## 2019-03-04 DIAGNOSIS — N400 Enlarged prostate without lower urinary tract symptoms: Secondary | ICD-10-CM | POA: Diagnosis not present

## 2019-03-04 DIAGNOSIS — G20 Parkinson's disease: Secondary | ICD-10-CM | POA: Diagnosis not present

## 2019-03-04 DIAGNOSIS — R531 Weakness: Secondary | ICD-10-CM | POA: Diagnosis not present

## 2019-03-04 DIAGNOSIS — R1310 Dysphagia, unspecified: Secondary | ICD-10-CM | POA: Diagnosis not present

## 2019-03-05 DIAGNOSIS — R531 Weakness: Secondary | ICD-10-CM | POA: Diagnosis not present

## 2019-03-05 DIAGNOSIS — N400 Enlarged prostate without lower urinary tract symptoms: Secondary | ICD-10-CM | POA: Diagnosis not present

## 2019-03-05 DIAGNOSIS — G20 Parkinson's disease: Secondary | ICD-10-CM | POA: Diagnosis not present

## 2019-03-05 DIAGNOSIS — R1310 Dysphagia, unspecified: Secondary | ICD-10-CM | POA: Diagnosis not present

## 2019-03-06 DIAGNOSIS — G20 Parkinson's disease: Secondary | ICD-10-CM | POA: Diagnosis not present

## 2019-03-06 DIAGNOSIS — R531 Weakness: Secondary | ICD-10-CM | POA: Diagnosis not present

## 2019-03-06 DIAGNOSIS — R1310 Dysphagia, unspecified: Secondary | ICD-10-CM | POA: Diagnosis not present

## 2019-03-06 DIAGNOSIS — N400 Enlarged prostate without lower urinary tract symptoms: Secondary | ICD-10-CM | POA: Diagnosis not present

## 2019-03-12 DIAGNOSIS — H903 Sensorineural hearing loss, bilateral: Secondary | ICD-10-CM | POA: Diagnosis not present

## 2019-04-15 DIAGNOSIS — G20 Parkinson's disease: Secondary | ICD-10-CM | POA: Diagnosis not present

## 2019-04-15 DIAGNOSIS — N400 Enlarged prostate without lower urinary tract symptoms: Secondary | ICD-10-CM | POA: Diagnosis not present

## 2019-04-15 DIAGNOSIS — R531 Weakness: Secondary | ICD-10-CM | POA: Diagnosis not present

## 2019-04-15 DIAGNOSIS — R1310 Dysphagia, unspecified: Secondary | ICD-10-CM | POA: Diagnosis not present

## 2019-04-16 DIAGNOSIS — R531 Weakness: Secondary | ICD-10-CM | POA: Diagnosis not present

## 2019-04-16 DIAGNOSIS — R1310 Dysphagia, unspecified: Secondary | ICD-10-CM | POA: Diagnosis not present

## 2019-04-16 DIAGNOSIS — G20 Parkinson's disease: Secondary | ICD-10-CM | POA: Diagnosis not present

## 2019-04-16 DIAGNOSIS — N400 Enlarged prostate without lower urinary tract symptoms: Secondary | ICD-10-CM | POA: Diagnosis not present

## 2019-04-21 DIAGNOSIS — N400 Enlarged prostate without lower urinary tract symptoms: Secondary | ICD-10-CM | POA: Diagnosis not present

## 2019-04-21 DIAGNOSIS — R1310 Dysphagia, unspecified: Secondary | ICD-10-CM | POA: Diagnosis not present

## 2019-04-21 DIAGNOSIS — G20 Parkinson's disease: Secondary | ICD-10-CM | POA: Diagnosis not present

## 2019-04-21 DIAGNOSIS — R531 Weakness: Secondary | ICD-10-CM | POA: Diagnosis not present

## 2019-04-22 DIAGNOSIS — N400 Enlarged prostate without lower urinary tract symptoms: Secondary | ICD-10-CM | POA: Diagnosis not present

## 2019-04-22 DIAGNOSIS — R531 Weakness: Secondary | ICD-10-CM | POA: Diagnosis not present

## 2019-04-22 DIAGNOSIS — R1310 Dysphagia, unspecified: Secondary | ICD-10-CM | POA: Diagnosis not present

## 2019-04-22 DIAGNOSIS — G20 Parkinson's disease: Secondary | ICD-10-CM | POA: Diagnosis not present

## 2019-04-24 DIAGNOSIS — R1310 Dysphagia, unspecified: Secondary | ICD-10-CM | POA: Diagnosis not present

## 2019-04-24 DIAGNOSIS — N400 Enlarged prostate without lower urinary tract symptoms: Secondary | ICD-10-CM | POA: Diagnosis not present

## 2019-04-24 DIAGNOSIS — R531 Weakness: Secondary | ICD-10-CM | POA: Diagnosis not present

## 2019-04-24 DIAGNOSIS — G20 Parkinson's disease: Secondary | ICD-10-CM | POA: Diagnosis not present

## 2019-04-25 DIAGNOSIS — R531 Weakness: Secondary | ICD-10-CM | POA: Diagnosis not present

## 2019-04-25 DIAGNOSIS — N400 Enlarged prostate without lower urinary tract symptoms: Secondary | ICD-10-CM | POA: Diagnosis not present

## 2019-04-25 DIAGNOSIS — R1310 Dysphagia, unspecified: Secondary | ICD-10-CM | POA: Diagnosis not present

## 2019-04-25 DIAGNOSIS — G20 Parkinson's disease: Secondary | ICD-10-CM | POA: Diagnosis not present

## 2019-04-28 DIAGNOSIS — N400 Enlarged prostate without lower urinary tract symptoms: Secondary | ICD-10-CM | POA: Diagnosis not present

## 2019-04-28 DIAGNOSIS — R1310 Dysphagia, unspecified: Secondary | ICD-10-CM | POA: Diagnosis not present

## 2019-04-28 DIAGNOSIS — R531 Weakness: Secondary | ICD-10-CM | POA: Diagnosis not present

## 2019-04-28 DIAGNOSIS — G20 Parkinson's disease: Secondary | ICD-10-CM | POA: Diagnosis not present

## 2019-04-29 DIAGNOSIS — G20 Parkinson's disease: Secondary | ICD-10-CM | POA: Diagnosis not present

## 2019-04-29 DIAGNOSIS — R531 Weakness: Secondary | ICD-10-CM | POA: Diagnosis not present

## 2019-04-29 DIAGNOSIS — N400 Enlarged prostate without lower urinary tract symptoms: Secondary | ICD-10-CM | POA: Diagnosis not present

## 2019-04-29 DIAGNOSIS — R1310 Dysphagia, unspecified: Secondary | ICD-10-CM | POA: Diagnosis not present

## 2019-04-30 DIAGNOSIS — R531 Weakness: Secondary | ICD-10-CM | POA: Diagnosis not present

## 2019-04-30 DIAGNOSIS — R1310 Dysphagia, unspecified: Secondary | ICD-10-CM | POA: Diagnosis not present

## 2019-04-30 DIAGNOSIS — G20 Parkinson's disease: Secondary | ICD-10-CM | POA: Diagnosis not present

## 2019-04-30 DIAGNOSIS — N400 Enlarged prostate without lower urinary tract symptoms: Secondary | ICD-10-CM | POA: Diagnosis not present

## 2019-05-01 DIAGNOSIS — R1310 Dysphagia, unspecified: Secondary | ICD-10-CM | POA: Diagnosis not present

## 2019-05-01 DIAGNOSIS — N400 Enlarged prostate without lower urinary tract symptoms: Secondary | ICD-10-CM | POA: Diagnosis not present

## 2019-05-01 DIAGNOSIS — G20 Parkinson's disease: Secondary | ICD-10-CM | POA: Diagnosis not present

## 2019-05-01 DIAGNOSIS — R531 Weakness: Secondary | ICD-10-CM | POA: Diagnosis not present

## 2019-05-02 DIAGNOSIS — G20 Parkinson's disease: Secondary | ICD-10-CM | POA: Diagnosis not present

## 2019-05-02 DIAGNOSIS — N400 Enlarged prostate without lower urinary tract symptoms: Secondary | ICD-10-CM | POA: Diagnosis not present

## 2019-05-02 DIAGNOSIS — R531 Weakness: Secondary | ICD-10-CM | POA: Diagnosis not present

## 2019-05-02 DIAGNOSIS — R1310 Dysphagia, unspecified: Secondary | ICD-10-CM | POA: Diagnosis not present

## 2019-05-03 DIAGNOSIS — R1310 Dysphagia, unspecified: Secondary | ICD-10-CM | POA: Diagnosis not present

## 2019-05-03 DIAGNOSIS — R531 Weakness: Secondary | ICD-10-CM | POA: Diagnosis not present

## 2019-05-03 DIAGNOSIS — N400 Enlarged prostate without lower urinary tract symptoms: Secondary | ICD-10-CM | POA: Diagnosis not present

## 2019-05-03 DIAGNOSIS — G20 Parkinson's disease: Secondary | ICD-10-CM | POA: Diagnosis not present

## 2019-05-05 DIAGNOSIS — N400 Enlarged prostate without lower urinary tract symptoms: Secondary | ICD-10-CM | POA: Diagnosis not present

## 2019-05-05 DIAGNOSIS — R531 Weakness: Secondary | ICD-10-CM | POA: Diagnosis not present

## 2019-05-05 DIAGNOSIS — R1310 Dysphagia, unspecified: Secondary | ICD-10-CM | POA: Diagnosis not present

## 2019-05-05 DIAGNOSIS — G20 Parkinson's disease: Secondary | ICD-10-CM | POA: Diagnosis not present

## 2019-05-06 DIAGNOSIS — R531 Weakness: Secondary | ICD-10-CM | POA: Diagnosis not present

## 2019-05-06 DIAGNOSIS — N400 Enlarged prostate without lower urinary tract symptoms: Secondary | ICD-10-CM | POA: Diagnosis not present

## 2019-05-06 DIAGNOSIS — R1310 Dysphagia, unspecified: Secondary | ICD-10-CM | POA: Diagnosis not present

## 2019-05-06 DIAGNOSIS — G20 Parkinson's disease: Secondary | ICD-10-CM | POA: Diagnosis not present

## 2019-05-07 DIAGNOSIS — R531 Weakness: Secondary | ICD-10-CM | POA: Diagnosis not present

## 2019-05-07 DIAGNOSIS — N400 Enlarged prostate without lower urinary tract symptoms: Secondary | ICD-10-CM | POA: Diagnosis not present

## 2019-05-07 DIAGNOSIS — G20 Parkinson's disease: Secondary | ICD-10-CM | POA: Diagnosis not present

## 2019-05-07 DIAGNOSIS — R1310 Dysphagia, unspecified: Secondary | ICD-10-CM | POA: Diagnosis not present

## 2019-05-08 DIAGNOSIS — R531 Weakness: Secondary | ICD-10-CM | POA: Diagnosis not present

## 2019-05-08 DIAGNOSIS — G20 Parkinson's disease: Secondary | ICD-10-CM | POA: Diagnosis not present

## 2019-05-08 DIAGNOSIS — R1310 Dysphagia, unspecified: Secondary | ICD-10-CM | POA: Diagnosis not present

## 2019-05-08 DIAGNOSIS — N400 Enlarged prostate without lower urinary tract symptoms: Secondary | ICD-10-CM | POA: Diagnosis not present

## 2019-05-09 DIAGNOSIS — R1310 Dysphagia, unspecified: Secondary | ICD-10-CM | POA: Diagnosis not present

## 2019-05-09 DIAGNOSIS — R531 Weakness: Secondary | ICD-10-CM | POA: Diagnosis not present

## 2019-05-09 DIAGNOSIS — N400 Enlarged prostate without lower urinary tract symptoms: Secondary | ICD-10-CM | POA: Diagnosis not present

## 2019-05-09 DIAGNOSIS — G20 Parkinson's disease: Secondary | ICD-10-CM | POA: Diagnosis not present

## 2019-05-12 DIAGNOSIS — G20 Parkinson's disease: Secondary | ICD-10-CM | POA: Diagnosis not present

## 2019-05-12 DIAGNOSIS — R1310 Dysphagia, unspecified: Secondary | ICD-10-CM | POA: Diagnosis not present

## 2019-05-12 DIAGNOSIS — R531 Weakness: Secondary | ICD-10-CM | POA: Diagnosis not present

## 2019-05-12 DIAGNOSIS — N400 Enlarged prostate without lower urinary tract symptoms: Secondary | ICD-10-CM | POA: Diagnosis not present

## 2019-05-13 DIAGNOSIS — R531 Weakness: Secondary | ICD-10-CM | POA: Diagnosis not present

## 2019-05-13 DIAGNOSIS — G20 Parkinson's disease: Secondary | ICD-10-CM | POA: Diagnosis not present

## 2019-05-13 DIAGNOSIS — N400 Enlarged prostate without lower urinary tract symptoms: Secondary | ICD-10-CM | POA: Diagnosis not present

## 2019-05-13 DIAGNOSIS — R1310 Dysphagia, unspecified: Secondary | ICD-10-CM | POA: Diagnosis not present

## 2019-05-14 DIAGNOSIS — R531 Weakness: Secondary | ICD-10-CM | POA: Diagnosis not present

## 2019-05-14 DIAGNOSIS — G20 Parkinson's disease: Secondary | ICD-10-CM | POA: Diagnosis not present

## 2019-05-14 DIAGNOSIS — R1310 Dysphagia, unspecified: Secondary | ICD-10-CM | POA: Diagnosis not present

## 2019-05-14 DIAGNOSIS — N400 Enlarged prostate without lower urinary tract symptoms: Secondary | ICD-10-CM | POA: Diagnosis not present

## 2019-05-15 DIAGNOSIS — R1310 Dysphagia, unspecified: Secondary | ICD-10-CM | POA: Diagnosis not present

## 2019-05-15 DIAGNOSIS — G20 Parkinson's disease: Secondary | ICD-10-CM | POA: Diagnosis not present

## 2019-05-15 DIAGNOSIS — N400 Enlarged prostate without lower urinary tract symptoms: Secondary | ICD-10-CM | POA: Diagnosis not present

## 2019-05-15 DIAGNOSIS — R531 Weakness: Secondary | ICD-10-CM | POA: Diagnosis not present

## 2019-05-16 DIAGNOSIS — N400 Enlarged prostate without lower urinary tract symptoms: Secondary | ICD-10-CM | POA: Diagnosis not present

## 2019-05-16 DIAGNOSIS — R1310 Dysphagia, unspecified: Secondary | ICD-10-CM | POA: Diagnosis not present

## 2019-05-16 DIAGNOSIS — G20 Parkinson's disease: Secondary | ICD-10-CM | POA: Diagnosis not present

## 2019-05-16 DIAGNOSIS — R531 Weakness: Secondary | ICD-10-CM | POA: Diagnosis not present

## 2019-05-19 DIAGNOSIS — N400 Enlarged prostate without lower urinary tract symptoms: Secondary | ICD-10-CM | POA: Diagnosis not present

## 2019-05-19 DIAGNOSIS — R1310 Dysphagia, unspecified: Secondary | ICD-10-CM | POA: Diagnosis not present

## 2019-05-19 DIAGNOSIS — G20 Parkinson's disease: Secondary | ICD-10-CM | POA: Diagnosis not present

## 2019-05-19 DIAGNOSIS — R531 Weakness: Secondary | ICD-10-CM | POA: Diagnosis not present

## 2019-05-20 DIAGNOSIS — R531 Weakness: Secondary | ICD-10-CM | POA: Diagnosis not present

## 2019-05-20 DIAGNOSIS — R1310 Dysphagia, unspecified: Secondary | ICD-10-CM | POA: Diagnosis not present

## 2019-05-20 DIAGNOSIS — G20 Parkinson's disease: Secondary | ICD-10-CM | POA: Diagnosis not present

## 2019-05-20 DIAGNOSIS — N400 Enlarged prostate without lower urinary tract symptoms: Secondary | ICD-10-CM | POA: Diagnosis not present

## 2019-05-21 DIAGNOSIS — G20 Parkinson's disease: Secondary | ICD-10-CM | POA: Diagnosis not present

## 2019-05-21 DIAGNOSIS — R531 Weakness: Secondary | ICD-10-CM | POA: Diagnosis not present

## 2019-05-21 DIAGNOSIS — R1310 Dysphagia, unspecified: Secondary | ICD-10-CM | POA: Diagnosis not present

## 2019-05-21 DIAGNOSIS — N400 Enlarged prostate without lower urinary tract symptoms: Secondary | ICD-10-CM | POA: Diagnosis not present

## 2019-05-22 DIAGNOSIS — N400 Enlarged prostate without lower urinary tract symptoms: Secondary | ICD-10-CM | POA: Diagnosis not present

## 2019-05-22 DIAGNOSIS — R1310 Dysphagia, unspecified: Secondary | ICD-10-CM | POA: Diagnosis not present

## 2019-05-22 DIAGNOSIS — R531 Weakness: Secondary | ICD-10-CM | POA: Diagnosis not present

## 2019-05-22 DIAGNOSIS — G20 Parkinson's disease: Secondary | ICD-10-CM | POA: Diagnosis not present

## 2019-05-23 DIAGNOSIS — N400 Enlarged prostate without lower urinary tract symptoms: Secondary | ICD-10-CM | POA: Diagnosis not present

## 2019-05-23 DIAGNOSIS — R531 Weakness: Secondary | ICD-10-CM | POA: Diagnosis not present

## 2019-05-23 DIAGNOSIS — R1310 Dysphagia, unspecified: Secondary | ICD-10-CM | POA: Diagnosis not present

## 2019-05-23 DIAGNOSIS — G20 Parkinson's disease: Secondary | ICD-10-CM | POA: Diagnosis not present

## 2019-05-26 DIAGNOSIS — G20 Parkinson's disease: Secondary | ICD-10-CM | POA: Diagnosis not present

## 2019-05-26 DIAGNOSIS — R531 Weakness: Secondary | ICD-10-CM | POA: Diagnosis not present

## 2019-05-26 DIAGNOSIS — R1310 Dysphagia, unspecified: Secondary | ICD-10-CM | POA: Diagnosis not present

## 2019-05-26 DIAGNOSIS — N400 Enlarged prostate without lower urinary tract symptoms: Secondary | ICD-10-CM | POA: Diagnosis not present

## 2019-05-27 DIAGNOSIS — R1310 Dysphagia, unspecified: Secondary | ICD-10-CM | POA: Diagnosis not present

## 2019-05-27 DIAGNOSIS — N400 Enlarged prostate without lower urinary tract symptoms: Secondary | ICD-10-CM | POA: Diagnosis not present

## 2019-05-27 DIAGNOSIS — G20 Parkinson's disease: Secondary | ICD-10-CM | POA: Diagnosis not present

## 2019-05-27 DIAGNOSIS — R531 Weakness: Secondary | ICD-10-CM | POA: Diagnosis not present

## 2019-05-28 DIAGNOSIS — N400 Enlarged prostate without lower urinary tract symptoms: Secondary | ICD-10-CM | POA: Diagnosis not present

## 2019-05-28 DIAGNOSIS — H903 Sensorineural hearing loss, bilateral: Secondary | ICD-10-CM | POA: Diagnosis not present

## 2019-05-28 DIAGNOSIS — R531 Weakness: Secondary | ICD-10-CM | POA: Diagnosis not present

## 2019-05-28 DIAGNOSIS — G20 Parkinson's disease: Secondary | ICD-10-CM | POA: Diagnosis not present

## 2019-05-28 DIAGNOSIS — R1310 Dysphagia, unspecified: Secondary | ICD-10-CM | POA: Diagnosis not present

## 2019-05-29 DIAGNOSIS — J129 Viral pneumonia, unspecified: Secondary | ICD-10-CM | POA: Diagnosis not present

## 2019-05-29 DIAGNOSIS — N400 Enlarged prostate without lower urinary tract symptoms: Secondary | ICD-10-CM | POA: Diagnosis not present

## 2019-05-29 DIAGNOSIS — R531 Weakness: Secondary | ICD-10-CM | POA: Diagnosis not present

## 2019-05-29 DIAGNOSIS — G20 Parkinson's disease: Secondary | ICD-10-CM | POA: Diagnosis not present

## 2019-05-29 DIAGNOSIS — R1310 Dysphagia, unspecified: Secondary | ICD-10-CM | POA: Diagnosis not present

## 2019-08-19 DIAGNOSIS — H903 Sensorineural hearing loss, bilateral: Secondary | ICD-10-CM | POA: Diagnosis not present

## 2019-11-19 DIAGNOSIS — H903 Sensorineural hearing loss, bilateral: Secondary | ICD-10-CM | POA: Diagnosis not present

## 2020-05-02 DEATH — deceased
